# Patient Record
Sex: Male | Born: 1940
Health system: Southern US, Community
[De-identification: ages and names within clinical notes are randomized; demographics above are authoritative.]

## PROBLEM LIST (undated history)

## (undated) DIAGNOSIS — N3010 Interstitial cystitis (chronic) without hematuria: Secondary | ICD-10-CM

## (undated) DIAGNOSIS — I639 Cerebral infarction, unspecified: Secondary | ICD-10-CM

## (undated) DIAGNOSIS — A3790 Whooping cough, unspecified species without pneumonia: Secondary | ICD-10-CM

## (undated) DIAGNOSIS — I10 Essential (primary) hypertension: Secondary | ICD-10-CM

## (undated) DIAGNOSIS — E785 Hyperlipidemia, unspecified: Secondary | ICD-10-CM

## (undated) DIAGNOSIS — A389 Scarlet fever, uncomplicated: Secondary | ICD-10-CM

## (undated) HISTORY — DX: Whooping cough, unspecified species without pneumonia: A37.90

## (undated) HISTORY — PX: CYSTOSTOMY W/ BLADDER BIOPSY: SHX1431

## (undated) HISTORY — DX: Scarlet fever, uncomplicated: A38.9

## (undated) HISTORY — PX: INGUINAL HERNIA REPAIR: SHX194

## (undated) HISTORY — PX: KNEE ARTHROSCOPY: SUR90

## (undated) HISTORY — PX: CATARACT EXTRACTION: SUR2

## (undated) HISTORY — DX: Cerebral infarction, unspecified: I63.9

## (undated) HISTORY — DX: Hyperlipidemia, unspecified: E78.5

---
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
## 2001-02-25 ENCOUNTER — Ambulatory Visit (HOSPITAL_COMMUNITY): Admission: RE | Admit: 2001-02-25 | Discharge: 2001-02-25 | Payer: Self-pay | Admitting: Internal Medicine

## 2001-02-25 ENCOUNTER — Encounter: Payer: Self-pay | Admitting: Internal Medicine

## 2001-03-04 ENCOUNTER — Ambulatory Visit (HOSPITAL_COMMUNITY): Admission: RE | Admit: 2001-03-04 | Discharge: 2001-03-04 | Payer: Self-pay | Admitting: Surgery

## 2003-12-22 ENCOUNTER — Ambulatory Visit: Payer: Self-pay | Admitting: Internal Medicine

## 2004-01-11 ENCOUNTER — Ambulatory Visit (HOSPITAL_COMMUNITY): Admission: RE | Admit: 2004-01-11 | Discharge: 2004-01-11 | Payer: Self-pay | Admitting: Urology

## 2004-01-11 ENCOUNTER — Ambulatory Visit (HOSPITAL_BASED_OUTPATIENT_CLINIC_OR_DEPARTMENT_OTHER): Admission: RE | Admit: 2004-01-11 | Discharge: 2004-01-11 | Payer: Self-pay | Admitting: Urology

## 2004-01-11 ENCOUNTER — Encounter (INDEPENDENT_AMBULATORY_CARE_PROVIDER_SITE_OTHER): Payer: Self-pay | Admitting: *Deleted

## 2004-06-18 ENCOUNTER — Ambulatory Visit: Payer: Self-pay | Admitting: Internal Medicine

## 2004-12-24 ENCOUNTER — Ambulatory Visit: Payer: Self-pay | Admitting: Internal Medicine

## 2004-12-30 ENCOUNTER — Ambulatory Visit: Payer: Self-pay | Admitting: Internal Medicine

## 2005-09-11 ENCOUNTER — Ambulatory Visit: Payer: Self-pay | Admitting: Internal Medicine

## 2005-11-27 ENCOUNTER — Ambulatory Visit: Payer: Self-pay | Admitting: Internal Medicine

## 2005-12-29 ENCOUNTER — Ambulatory Visit: Payer: Self-pay | Admitting: Internal Medicine

## 2006-10-06 ENCOUNTER — Ambulatory Visit: Payer: Self-pay | Admitting: Internal Medicine

## 2006-10-06 LAB — CONVERTED CEMR LAB
BUN: 7 mg/dL (ref 6–23)
Bilirubin Urine: NEGATIVE
CO2: 34 meq/L — ABNORMAL HIGH (ref 19–32)
Calcium: 9.3 mg/dL (ref 8.4–10.5)
Chloride: 104 meq/L (ref 96–112)
Creatinine, Ser: 0.9 mg/dL (ref 0.4–1.5)
Crystals: NEGATIVE
GFR calc Af Amer: 108 mL/min
GFR calc non Af Amer: 89 mL/min
Glucose, Bld: 92 mg/dL (ref 70–99)
Ketones, ur: NEGATIVE mg/dL
Nitrite: NEGATIVE
Potassium: 4.3 meq/L (ref 3.5–5.1)
Sodium: 142 meq/L (ref 135–145)
Specific Gravity, Urine: 1.025 (ref 1.000–1.03)
Squamous Epithelial / HPF: NEGATIVE /lpf
Total Protein, Urine: NEGATIVE mg/dL
Urine Glucose: NEGATIVE mg/dL
Urobilinogen, UA: 0.2 (ref 0.0–1.0)
pH: 6 (ref 5.0–8.0)

## 2007-04-09 DIAGNOSIS — K5730 Diverticulosis of large intestine without perforation or abscess without bleeding: Secondary | ICD-10-CM | POA: Insufficient documentation

## 2007-04-09 DIAGNOSIS — K649 Unspecified hemorrhoids: Secondary | ICD-10-CM | POA: Insufficient documentation

## 2007-04-09 DIAGNOSIS — E785 Hyperlipidemia, unspecified: Secondary | ICD-10-CM

## 2007-04-09 DIAGNOSIS — N3010 Interstitial cystitis (chronic) without hematuria: Secondary | ICD-10-CM

## 2007-04-09 DIAGNOSIS — N400 Enlarged prostate without lower urinary tract symptoms: Secondary | ICD-10-CM | POA: Insufficient documentation

## 2007-11-09 ENCOUNTER — Ambulatory Visit: Payer: Self-pay | Admitting: Internal Medicine

## 2007-11-09 DIAGNOSIS — K6289 Other specified diseases of anus and rectum: Secondary | ICD-10-CM

## 2007-11-09 DIAGNOSIS — A389 Scarlet fever, uncomplicated: Secondary | ICD-10-CM

## 2007-11-09 HISTORY — DX: Scarlet fever, uncomplicated: A38.9

## 2007-11-10 DIAGNOSIS — A3790 Whooping cough, unspecified species without pneumonia: Secondary | ICD-10-CM

## 2007-11-10 HISTORY — DX: Whooping cough, unspecified species without pneumonia: A37.90

## 2007-11-15 ENCOUNTER — Ambulatory Visit: Payer: Self-pay | Admitting: Internal Medicine

## 2007-11-15 LAB — CONVERTED CEMR LAB
BUN: 10 mg/dL (ref 6–23)
Creatinine, Ser: 0.8 mg/dL (ref 0.4–1.5)

## 2007-11-16 ENCOUNTER — Ambulatory Visit: Payer: Self-pay | Admitting: Internal Medicine

## 2007-11-17 ENCOUNTER — Encounter: Payer: Self-pay | Admitting: Internal Medicine

## 2008-03-15 ENCOUNTER — Ambulatory Visit (HOSPITAL_BASED_OUTPATIENT_CLINIC_OR_DEPARTMENT_OTHER): Admission: RE | Admit: 2008-03-15 | Discharge: 2008-03-15 | Payer: Self-pay | Admitting: Urology

## 2008-03-15 ENCOUNTER — Encounter (INDEPENDENT_AMBULATORY_CARE_PROVIDER_SITE_OTHER): Payer: Self-pay | Admitting: Urology

## 2009-06-19 ENCOUNTER — Encounter: Payer: Self-pay | Admitting: Internal Medicine

## 2009-07-25 ENCOUNTER — Encounter: Payer: Self-pay | Admitting: Internal Medicine

## 2009-12-20 ENCOUNTER — Ambulatory Visit: Payer: Self-pay | Admitting: Internal Medicine

## 2010-01-31 ENCOUNTER — Encounter: Payer: Self-pay | Admitting: Internal Medicine

## 2010-02-12 NOTE — Letter (Signed)
[facility]   Imported By: Sherian Rein 07/31/2009 11:30:38  _____________________________________________________________________  External Attachment:    Type:   Image     Comment:   External Document

## 2010-02-12 NOTE — Letter (Signed)
Summary: [facility]   Imported By: Lester [place] 06/26/2009 12:19:01  _____________________________________________________________________  External Attachment:    Type:   Image     Comment:   External Document

## 2010-02-14 ENCOUNTER — Ambulatory Visit: Admit: 2010-02-14 | Payer: Self-pay | Admitting: Internal Medicine

## 2010-02-14 ENCOUNTER — Ambulatory Visit: Payer: Medicare Other | Admitting: Internal Medicine

## 2010-02-14 NOTE — Assessment & Plan Note (Signed)
Summary: INFECTED SORE/NWS   Vital Signs:  Patient profile:   70 year old male Height:      69 inches Weight:      175 pounds BMI:     25.94 O2 Sat:      96 % on Room air Temp:     96.8 degrees F oral Pulse rate:   117 / minute BP sitting:   154 / 90  (left arm) Cuff size:   regular  Vitals Entered By: Bill Salinas CMA (December 20, 2009 4:01 PM)  O2 Flow:  Room air CC: pt here for evaluation of infected sore near his tailbone/ ab   Primary Care Provider:  Jacques Navy MD  CC:  pt here for evaluation of infected sore near his tailbone/ ab.  History of Present Illness: Patient has an area of soreness and drainage of a white material from the intertriginous area between the buttocks just above the coccyx in the area of the sacrum. No bleeding. No fever or chills. the area is painful.   Current Medications (verified): 1)  Flomax 0.4 Mg  Cp24 (Tamsulosin Hcl) .... Take 1 Tablet By Mouth Once A Day 2)  Hydroxyzine Hcl 25 Mg  Tabs (Hydroxyzine Hcl) .... Take 1 Tab By Mouth At Bedtime 3)  Analpram-Hc 1-1 % Crea (Hydrocortisone Ace-Pramoxine) .... Apply To Rectal Area As Needed. 4)  Vesicare 10 Mg Tabs (Solifenacin Succinate) .Marland Kitchen.. 1 Tablet Once Daily 5)  Elmiron 100 Mg Caps (Pentosan Polysulfate Sodium) .... 2 Capsules Two Times A Day  Allergies (verified): 1)  ! * Ivp Dye  Past History:  Past Medical History: Last updated: 11/09/2007 Hx of WHOOPING COUGH (ICD-033.9) Hx of SCARLET FEVER (ICD-034.1) DYSLIPIDEMIA (ICD-272.4) BENIGN PROSTATIC HYPERTROPHY (ICD-600.00) DIVERTICULOSIS, COLON (ICD-562.10) HEMORRHOIDS (ICD-455.6) INTERSTITIAL CYSTITIS (ICD-595.1)  Past Surgical History: Last updated: 11/09/2007 Hernia repair-bilateral '03 (D. Newman) Left knee arthroscopy Fracutre left elbow -avulsion type tonsillectomy remotely SH/Risk Factors reviewed for relevance  Review of Systems       The patient complains of weight loss, muscle weakness, suspicious skin lesions,  and difficulty walking.  The patient denies anorexia, chest pain, syncope, dyspnea on exertion, abdominal pain, severe indigestion/heartburn, depression, and enlarged lymph nodes.    Physical Exam  General:  Well-developed,well-nourished,in no acute distress; alert,appropriate and cooperative throughout examination Head:  normocephalic and atraumatic.   Eyes:  pupils equal and pupils round.   Lungs:  normal respiratory effort.   Heart:  normal rate and regular rhythm.   Abdomen:  soft and normal bowel sounds.   Msk:  no joint tenderness and no joint swelling.   Pulses:  2+ radial Skin:  over the sacrum, in the intertriginous space there are three large skin tags, no abscess lesion or sinus track. The surrounding skin is very erythematous with a scant white fluid/wet lesion. Psych:  normally interactive and not anxious appearing.     Impression & Recommendations:  Problem # 1:  FUNGAL DERMATITIS (ICD-111.9) Area at the sacrum in the intertriginous space  has the appearance of a fungal rash. There is no abscess or sinus track.  Plan - wash and thoroughly dry two times a day           apply topical lamisil two times a day           oral fluconazole 100mg  once daily x 14.           If no improvement over the next 4-5 days will need dermatology consult.  His updated medication list for this problem includes:    Fluconazole 100 Mg Tabs (Fluconazole) .Marland Kitchen... 1 by mouth once daily x 14 days for fungal infection  Complete Medication List: 1)  Flomax 0.4 Mg Cp24 (Tamsulosin hcl) .... Take 1 tablet by mouth once a day 2)  Hydroxyzine Hcl 25 Mg Tabs (Hydroxyzine hcl) .... Take 1 tab by mouth at bedtime 3)  Analpram-hc 1-1 % Crea (Hydrocortisone ace-pramoxine) .... Apply to rectal area as needed. 4)  Vesicare 10 Mg Tabs (Solifenacin succinate) .Marland Kitchen.. 1 tablet once daily 5)  Elmiron 100 Mg Caps (Pentosan polysulfate sodium) .... 2 capsules two times a day 6)  Fluconazole 100 Mg Tabs (Fluconazole)  .Marland Kitchen.. 1 by mouth once daily x 14 days for fungal infection 7)  Doxycycline Hyclate 100 Mg Tabs (Doxycycline hyclate) .Marland Kitchen.. 1 by mouth two times a day x 7 days for possible bacterial skin infection.  Patient Instructions: 1)  area between the cheeks at the sacrum appears to be two enlarged skin tags with no deep wound/a abscess with a surrounding area of redness and swelling. This has the appearance of a fungal/yeast infection. Plan - twice a day: wash with soap and water, thoroughly dry (may need to use a hair dryer), then apply a thin coat of lamisil. Also take fluconazole 100 mg daily for 14 days (antifungal medication) and doxycycline 100mg  two times a day for 7 days. If no improvement in a week weill refer to dermatology. Prescriptions: DOXYCYCLINE HYCLATE 100 MG TABS (DOXYCYCLINE HYCLATE) 1 by mouth two times a day x 7 days for possible bacterial skin infection.  #14 x 0   Entered and Authorized by:   Jacques Navy MD   Signed by:   Jacques Navy MD on 12/20/2009   Method used:   Electronically to        [facility]  [phone]* (retail)       [address] [place], [place]  [address]       Ph: [phone] or [phone]       Fax: [phone]   RxID:   [id number] FLUCONAZOLE 100 MG TABS (FLUCONAZOLE) 1 by mouth once daily x 14 days for fungal infection  #14 x 0   Entered and Authorized by:   Jacques Navy MD   Signed by:   Jacques Navy MD on 12/20/2009   Method used:   Electronically to        [facility]  [phone]* (retail)       [address] [place], [place]  [address]       Ph: [phone] or [phone]       Fax: [phone]   RxID:   [id number]    Orders Added: 1)  Est. Patient Level III [42706]

## 2010-02-14 NOTE — Letter (Signed)
[facility]   Imported By: Sherian Rein 02/07/2010 10:53:10  _____________________________________________________________________  External Attachment:    Type:   Image     Comment:   External Document

## 2010-03-04 ENCOUNTER — Ambulatory Visit (INDEPENDENT_AMBULATORY_CARE_PROVIDER_SITE_OTHER): Payer: Medicare Other | Admitting: Internal Medicine

## 2010-03-04 ENCOUNTER — Encounter: Payer: Self-pay | Admitting: Internal Medicine

## 2010-03-04 DIAGNOSIS — N3010 Interstitial cystitis (chronic) without hematuria: Secondary | ICD-10-CM

## 2010-03-04 DIAGNOSIS — Z23 Encounter for immunization: Secondary | ICD-10-CM

## 2010-03-04 DIAGNOSIS — K6289 Other specified diseases of anus and rectum: Secondary | ICD-10-CM

## 2010-03-12 NOTE — Assessment & Plan Note (Signed)
Summary: f/u appt/#/cd   Vital Signs:  Patient profile:   70 year old male Height:      68 inches Weight:      177 pounds (80.45 kg) BMI:     27.01 O2 Sat:      94 % on Room air Temp:     97.9 degrees F (36.61 degrees C) oral Pulse rate:   108 / minute Resp:     14 per minute BP sitting:   102 / 68  (left arm) Cuff size:   regular  Vitals Entered By: Burnard Leigh CMA(AAMA) (March 04, 2010 2:23 PM)  O2 Flow:  Room air CC: F/U on tailbone sore..Discuss bowels & rectum issues..Discuss vaccines/sls,cma Is Patient Diabetic? No Comments Sore is healing..Pt states he is having issues w/bowels & rectum internal and external.. Would like to get Flu vaccine & discuss Zostavax vaccine/sls,cma   Primary Care Provider:  Jacques Navy MD  CC:  F/U on tailbone sore..Discuss bowels & rectum issues..Discuss vaccines/sls and cma.  History of Present Illness: Cody [name] presents for follow-up. He is followed by urology for biopsy diagnosed Interstial cystitis. He continues to have perineal pain, back pain. HE does report that he can get relief of his symptoms with micturition. He has had a very thorough evaluation including CT imagaing abdomen and pelvis, he has had colonoscopy which was unrevealing. No other etiology for his pain has been determined.  Cody [name] in the past has had skin breakdown/candidiasis at the sacrum successfully treated with topical antifungal and oral antifungal agents. He is having recurrent pain at the sacrum and it is painful to sit down.   Current Medications (verified): 1)  Flomax 0.4 Mg  Cp24 (Tamsulosin Hcl) .... Take 1 Tablet By Mouth Once A Day 2)  Hydroxyzine Hcl 25 Mg  Tabs (Hydroxyzine Hcl) .... Take 1 Tab By Mouth At Bedtime 3)  Vesicare 10 Mg Tabs (Solifenacin Succinate) .Marland Kitchen.. 1 Tablet Once Daily 4)  Elmiron 100 Mg Caps (Pentosan Polysulfate Sodium) .... 2 Capsules Two Times A Day  Allergies (verified): 1)  ! * Ivp Dye  Past History:  Past  Medical History: Last updated: 11/09/2007 Hx of WHOOPING COUGH (ICD-033.9) Hx of SCARLET FEVER (ICD-034.1) DYSLIPIDEMIA (ICD-272.4) BENIGN PROSTATIC HYPERTROPHY (ICD-600.00) DIVERTICULOSIS, COLON (ICD-562.10) HEMORRHOIDS (ICD-455.6) INTERSTITIAL CYSTITIS (ICD-595.1)  Past Surgical History: Last updated: 11/09/2007 Hernia repair-bilateral '03 (D. 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  Review of Systems  The patient denies anorexia, fever, weight loss, weight gain, decreased hearing, chest pain, syncope, dyspnea on exertion, prolonged cough, abdominal pain, severe indigestion/heartburn, incontinence, muscle weakness, difficulty walking, abnormal bleeding, and enlarged lymph nodes.    Physical Exam  General:  somewhat haggard appearing white male Head:  normocephalic and atraumatic.   Eyes:  C&S clear Neck:  supple and full ROM.   Lungs:  normal respiratory effort, normal breath sounds, no crackles, and no wheezes.   Heart:  normal rate and regular rhythm.   Skin:  raw appearing erythematous lesion at the sacrum with erosions and wet exudate without odor or appearance of frank purulence. Psych:  Oriented X3, normally interactive, and good eye contact.     Impression & Recommendations:  Problem # 1:  ANAL OR RECTAL PAIN (ICD-569.42) fungal/candidiasis lesion at the sacrum  Plan - wash with soap and water and carefully dry -  use of hair dryer to insure dryness           application of lamisil           Carry out this treatment two times a day.  Problem # 2:  INTERSTITIAL CYSTITIS (ICD-595.1) long term chronic problem and most likely cause of perineal/abdominal pain.   Complete Medication List: 1)  Flomax 0.4 Mg Cp24 (Tamsulosin hcl) .... Take 1 tablet by mouth once a day 2)   Hydroxyzine Hcl 25 Mg Tabs (Hydroxyzine hcl) .... Take 1 tab by mouth at bedtime 3)  Vesicare 10 Mg Tabs (Solifenacin succinate) .Marland Kitchen.. 1 tablet once daily 4)  Elmiron 100 Mg Caps (Pentosan polysulfate sodium) .... 2 capsules two times a day  Other Orders: Flu Vaccine 67yrs + MEDICARE PATIENTS (N5621) Administration Flu vaccine - MCR (G0008) TD Toxoids IM 7 YR + (30865) Influenza Vaccine MCR (78469) Admin 1st Vaccine (62952)   Orders Added: 1)  Flu Vaccine 61yrs + MEDICARE PATIENTS [Q2039] 2)  Administration Flu vaccine - MCR [G0008] 3)  TD Toxoids IM 7 YR + [90714] 4)  Influenza Vaccine MCR [00025] 5)  Admin 1st Vaccine [90471] 6)  Est. Patient Level III [84132]   Immunizations Administered:  Influenza Vaccine # 1:    Vaccine Type: Fluvax MCR    Site: left deltoid    Mfr: Sanofi Pasteur    Dose: 0.5 ml    Route: IM    Given by: Burnard Leigh CMA(AAMA)    Exp. Date: 07/13/2010    Lot #: [id number]    VIS given: 08/07/09 version given March 04, 2010.  Tetanus Vaccine:    Vaccine Type: Td    Site: right deltoid    Mfr: Sanofi Pasteur    Dose: 0.5 ml    Route: IM    Given by: Burnard Leigh CMA(AAMA)    Exp. Date: 04/26/2011    Lot #: [id number]    VIS given: 12/01/07 version given March 04, 2010.   Immunizations Administered:  Influenza Vaccine # 1:    Vaccine Type: Fluvax MCR    Site: left deltoid    Mfr: Sanofi Pasteur    Dose: 0.5 ml    Route: IM    Given by: Burnard Leigh CMA(AAMA)    Exp. Date: 07/13/2010    Lot #: [id number]    VIS given: 08/07/09 version given March 04, 2010.  Tetanus Vaccine:    Vaccine Type: Td    Site: right deltoid    Mfr: Sanofi Pasteur    Dose: 0.5 ml    Route: IM    Given by: Burnard Leigh CMA(AAMA)    Exp. Date: 04/26/2011    Lot #: [id number]    VIS given: 12/01/07 version given March 04, 2010.

## 2010-04-25 LAB — BASIC METABOLIC PANEL
BUN: 10 mg/dL (ref 6–23)
CO2: 27 mEq/L (ref 19–32)
Calcium: 9.1 mg/dL (ref 8.4–10.5)
Chloride: 103 mEq/L (ref 96–112)
Creatinine, Ser: 0.79 mg/dL (ref 0.4–1.5)
GFR calc Af Amer: >60 mL/min (ref >60)
GFR calc non Af Amer: >60 mL/min (ref >60)
Glucose, Bld: 87 mg/dL (ref 70–99)
Potassium: 4.2 mEq/L (ref 3.5–5.1)
Sodium: 139 mEq/L (ref 135–145)

## 2010-04-25 LAB — CBC
HCT: 52.3 % — ABNORMAL HIGH (ref 39.0–52.0)
Hemoglobin: 17.3 g/dL — ABNORMAL HIGH (ref 13.0–17.0)
MCHC: 33.1 g/dL (ref 30.0–36.0)
MCV: 90.3 fL (ref 78.0–100.0)
Platelets: 198 10*3/uL (ref 150–400)
RBC: 5.8 MIL/uL (ref 4.22–5.81)
RDW: 14.2 % (ref 11.5–15.5)
WBC: 8.5 10*3/uL (ref 4.0–10.5)

## 2010-05-28 NOTE — Op Note (Signed)
NAME:  Cody [name], Cody [name] NO.:  [id number]   MEDICAL RECORD NO.:  [id number]          PATIENT TYPE:  AMB   LOCATION:  [facility]                         FACILITY:  [facility]   PHYSICIAN:  Houston M. Kimbrough, M.D.DATE OF BIRTH:  [birth year]   DATE OF PROCEDURE:  03/15/2008  DATE OF DISCHARGE:                               OPERATIVE REPORT   PREOPERATIVE DIAGNOSIS:  Chronic interstitial cystitis, inflammatory  lesion, right posterior base, microscopic hematuria.   POSTOPERATIVE DIAGNOSIS:  Chronic interstitial cystitis, inflammatory  lesion, right posterior base, microscopic hematuria.   OPERATION:  Cystoscopy, bladder biopsy and fulguration, right posterior  base (1.5 cm), and hydraulic distention of the bladder.   ANESTHESIA:  General.   SURGEON:  Courtney Paris, M.D.   BRIEF HISTORY:  This 70 year old patient with previous IC diagnosed on  biopsy by Dr. Logan Bores in 2005 comes in with small-capacity, unstable  bladder on video urodynamic study.  He also has symptoms of obstruction,  but has been on Flomax.  He has frequency every hour or less.  He gets  up twice a night.  He also has frequent stools possible IBS.  He enters  now because on office cystoscopy he had some markedly inflamed areas of  the posterior base of the bladder, mainly on the right side and a  positive NMP-22.  It is thought that he has chronic IC, but he has not  been on Elmiron for years because of diarrhea.  He enters now for  bladder biopsy at this time.  The patient requests a Foley at the end of  the procedure.   The patient was placed on the operating table in the supine position.  After satisfactory induction of general anesthesia, he was placed in  dorsal lithotomy.  He was prepped and draped with a non-iodine-  containing substance due to his questionable history of iodine allergy.  Time-out was then performed and the patient and procedure then re-  confirmed.   His rectum was quite  irritated, as well, from his chronic diarrhea.  A  21 panendoscope was passed.  Anterior urethra was normal.  Post urethra  was nonobstructing.  The bladder was entered.  The bladder was markedly  inflamed and very sensitive.  With any kind of distention, 100 mL or  less, he had pain.  Anesthesia had difficulty keeping him at a deep  level of anesthesia.  I did get a view of the right posterior base and  took three biopsies of this area and then fulgurated the area for about  1.5 cm.  This effected pretty good hemostasis.  I then distended the  bladder to probably only about 200 mL under anesthesia.  There was some  oozing from different areas, which were fulgurated, but hemostasis  appeared to be adequate.  The bladder was then drained, scope removed  and a #20 Foley catheter inserted and the bladder irrigated with a liter  of saline to make the efflux just light pink.  This was left to straight  drainage, attached to a leg bag.  The patient was taken to the  recovery  room in good condition.  He did get a B and O suppository.   It looks like he has IC.  We will put him back on Elmiron, put him on  some oxycodone for pain, put him on some Vesicare for his urgency and  some antibiotics with Cipro.  We will leave the catheter until next  week, remove it at that time and proceed after the biopsy reports are  obtained.      Courtney Paris, M.D.  Electronically Signed     HMK/MEDQ  D:  03/15/2008  T:  03/15/2008  Job:  [id number]

## 2010-05-31 NOTE — Op Note (Signed)
NAME:  Cody [name], Cody [name] NO.:  [id number]   MEDICAL RECORD NO.:  [id number]          PATIENT TYPE:  AMB   LOCATION:  [facility]                         [facility]AC[facility]   PHYSICIAN:  Jamison Neighbor, M.D.  DATE OF BIRTH:  Dec 07, 1940   DATE OF PROCEDURE:  01/11/2004  DATE OF DISCHARGE:                                 OPERATIVE REPORT   PREOPERATIVE DIAGNOSIS:  Hematuria.   SECONDARY DIAGNOSIS:  Chronic pelvic pain syndrome/chronic prostatitis.   POSTOPERATIVE DIAGNOSES:  1.  Hematuria.  2.  Probable interstitial cystitis.   PROCEDURE:  Cystoscopy, attempted bilateral retrogrades, bladder biopsy x5  (right, left, dome, posterior and trigone).   SURGEON:  Jamison Neighbor, M.D.   ANESTHESIA:  General.   COMPLICATIONS:  None.   DRAINS:  None.   INDICATIONS FOR PROCEDURE:  This 70 year old male has been treated for a  number of years for what has been presumed to be chronic prostatitis.  The  patient was treated in the usual fashion with alpha blockade, anti-  inflammatories and antibiotics, and feels that he might be somewhat better.  Unfortunately, however, he has continued to have problems with persistent  hematuria and pyuria, even though he has negative urine cultures.  The  patient underwent a cystoscopic examination, which showed moderate  enlargement of the prostate, but no real obstruction, and some patchy areas  that were quite irregular within the bladder.  The patient is now to undergo  diagnostic cystoscopy with biopsy of the bladder.  He understands the risks  and benefits of the procedure, and gave a full informed consent.   DESCRIPTION OF PROCEDURE:  After a successful induction of general  anesthesia, the patient was placed in the dorsal lithotomy position and  prepped with Betadine and draped in the usual sterile fashion.  A cystoscopy  was performed.  The urethra was visualized in its entirety and was found to  be normal.  Beyond the  verumontanum, there was not much in the way of  prostatic enlargement, certainly nothing that was causing obstruction.  The  bladder was carefully inspected, and almost immediately upon filling, there  was bleeding throughout the bladder.  No tumors or stones could be seen, but  the mucosa was very irregular, and even with minimal filling, began to bleed  and glomerular-like, consistent with a severe case of interstitial cystitis.  The ureters could never be well-visualized.  It was noted that during the  filling stage, even before the decision was made to perform a formal  hydrodistention, the patient exhibited significant pain and moved quite a  bit on the table.  He was given a deeper anesthetic, but it was clear that  this was very stimulating for the patient, which once again would suggest an  IC diagnosis as a possibility.  The decision was made to perform a  hydrodistention.  The patient was filled for five minutes and drained, and  as expected globalizations and bleeding were seen throughout the bladder.  The bladder capacity was only 250 mL, which is exceptionally low, given the  normal bladder capacity,  even in women is 1200 mL on the average.  For IC is  approximately 575 mL.  The patient had five separate biopsies done, in order  to ensure that there was good coverage.  This included the right side, left  side, dome, posterior and trigone areas.  These were all cauterized.  The  patient's bladder was drained, and he was washed out several times.  He tolerated the procedure well and was taken to the recovery room in good  condition.   DISPOSITION:  He will be sent home with pain medicine plus an antibiotic,  and will return in followup.  We give some consideration to placing him on  an Elmiron-based protocol, as well as his Lasix therapy, to try to treat  this bladder condition.      RJE/MEDQ  D:  01/11/2004  T:  01/11/2004  Job:  [id number]   cc:   Rosalyn Gess. Norins, M.D.  [facility]

## 2010-05-31 NOTE — Assessment & Plan Note (Signed)
HEALTHC[facility]                           GASTROENTEROLOGY OFFICE NOTE   NAME:[name], [name]                       MRN:          [id number]  DATE:  11/27/2005                              DOB:    REFERRING PHYSICIAN:  Rosalyn Gess. Norins, MD   CHIEF COMPLAINT:  Change in bowel habits, rectal pain.   ASSESSMENT:  1. Change in bowel habits with change in stool caliber and frequency.  He      has some pelvic pain and rectal pain with this as well.  He has      interstitial cystitis.  I am suspicious of irritable bowel phenomenon      but in his age group, a change in bowel habits like this could      represent colon cancer or perhaps even an inflammatory bowel disease.      Previous colonoscopy September 12, 2003 demonstrated diverticulosis and      small hemorrhoids.   PLAN:  1. Colonoscopy to investigate.  2. Trial of Levbid.  3. He has follow-up with Dr. Logan [name] (yearly follow-up) soon.  He has      stopped his Elmiron because that caused some diarrhea.   HISTORY OF PRESENT ILLNESS:  This is a 70 year old white man that I know  from previous screening colonoscopy.  Since that time he was diagnosed with  interstitial cystitis.  He has varying degrees of pressure discomfort and  stinging needle-like pain in the groin and pelvic area and in the rectal  area at times.  He has flat and short and smaller stools at times.  This is  intermittent.  He can have several bowel movements and he has soreness.  He  has a lot of problems when he is sitting.  He feels better when he is lying  down.  He has urinary frequency and these symptoms seem to aggravate each  other.  Sometimes when he goes to defecate he urinates unexpectedly, he  says.  He has some urge problems and some urge incontinence or near  incontinence at times, it sounds like.  He denies any bleeding.  He was  recently treated for suspected prostatitis by Dr. Debby [name] with some benefit  (course of  Cipro).   MEDICATIONS:  1. Flomax 0.4 mg daily.  2. Saw Palmetto daily.  3. Aspirin 81 mg daily.  4. Hydroxyzine 25 mg q.h.s.  5. Citrucel p.r.n.   ALLERGIES:  Drug allergies:  None known.   PAST MEDICAL HISTORY:  1. Interstitial cystitis.  2. Hernia repair.  3. Diverticulosis and hemorrhoids.  4. Left knee arthroscopy.  5. Benign prostatic hypertrophy.  6. Dyslipidemia.   FAMILY HISTORY:  Mother had colon cancer.  I do not believe I was aware of  that at the time of his previous colonoscopy.   SOCIAL HISTORY:  He is married.  He is Dr. Ludwig [name] father-in-law.  He is  working as a Librarian, academic.  No alcohol, tobacco, or drugs.   REVIEW OF SYSTEMS:  Otherwise negative, except for those things mentioned  above.   PHYSICAL EXAMINATION:  GENERAL:  Well-developed, well-nourished, elderly,  white man.  VITAL SIGNS:  Weight 187 pounds, blood pressure 168/90, pulse 88.  HEENT:  Eyes anicteric.  ENT:  Normal.  NECK:  Supple.  No thyromegaly or masses.  CHEST:  Clear.  HEART:  Without murmurs or gallops.  ABDOMEN:  Soft, nontender.  No masses.  RECTAL:  Mildly tender to touch.  He has some perianal erythema.  The  prostate is mildly enlarged and mildly tender.  LYMPHATICS:  No supraclavicular nodes.  SKIN:  No rash except for the perianal erythema.  It is confluent.  There  are no satellite lesions.  PSYCHIATRIC:  He is alert and oriented x3.   Note:  I have given him some samples of Calmoseptine ointment to apply to  his perianal area to try to help this rash.  He does not seem to have  leakage of stool or diarrhea to any great degree.  He has been cleansing  quite a bit with wipes and KY Jelly.  Maybe it is a traumatic process from  all of that.     Iva Boop, MD,FACG  Electronically Signed    CEG/MedQ  DD: 11/27/2005  DT: 11/27/2005  Job #: [id number]   cc:   Cody Gess. Cody Bud, MD  Cody [name], M.D.

## 2010-05-31 NOTE — Assessment & Plan Note (Signed)
[facility]                             PRIMARY CARE OFFICE NOTE   NAME:Cody [name], Cody [name]                       MRN:          [id number]  DATE:09/11/2005                            DOB:          1940-11-15    [name] is a very pleasant 70 year old gentleman who presents  complaining of low back pain, perineal pain, discomfort with bowel movement,  change in bowel caliber.  Reports the pain is frequently associated with  bowel movements.  He has chronic urinary urgency and frequency but has noted  slight increase recently and a little bit of difficulty starting a stream.  Patient has been seen by Dr. Eudelia Bunch in the past, last office note from  October 30, 2004.  He has had a problem with chronic pelvic pain, syndrome  of interstitial cystitis.  The patient did have cystoscopy in December,  2005.   PAST MEDICAL HISTORY:  Remarkable for arthroscopy of the left knee,  laparoscopic hernia repair bilaterally.  Medical illnesses include BPH,  hyperlipidemia, interstitial cystitis.   CURRENT MEDICATIONS:  1. Flomax 0.4 mg daily.  2. Saw palmetto daily.  3. Baby aspirin daily.  4. Hydroxyzine 25 mg nightly.  5. Citrucel.   PHYSICAL EXAMINATION:  VITAL SIGNS:  Temperature was 97.9, blood pressure  155/88, pulse 84.  Weight 187.  GENERAL APPEARANCE:  A well-developed and well-nourished Caucasian male in  no acute distress.  ABDOMEN:  Patient has hypoactive bowel sounds throughout.  He had no  guarding or rebound.  He had tenderness to palpation bilaterally in the  lower quadrants.  RECTAL:  Patient had normal sphincter tone.  He had a small hemorrhoid.  Patient's prostate was warm to the touch, slightly boggy, and exquisitely  tender to palpation.   IMPRESSION/PLAN:  Acute prostatitis:  Patient is to laboratory for post  prostatitic massage UA with mid stream catch.  He is started on  ciprofloxacin 250 mg b.i.d. for 14 days with one refill on  that  prescription.  Patient does have a follow-up appointment scheduled with Dr.  Eudelia Bunch, and I told him to keep his appointment as scheduled.                                   Rosalyn Gess Norins, MD   MEN/MedQ  DD:  09/11/2005  DT:  09/11/2005  Job #:  [id number]   cc:   Jamison Neighbor, MD

## 2010-05-31 NOTE — Op Note (Signed)
[facility]  Patient:    Cody [name], Cody [name] Visit Number: [id number] MRN: [id number]          Service Type: Attending:  Sandria Bales. Ezzard Standing, M.D. Dictated by:   Sandria Bales. Ezzard Standing, M.D. Proc. Date: 03/04/01   CC:         Rosalyn Gess. Norins, M.D. [facility]  Madolyn Frieze. Jens Som, M.D. [facility]   Operative Report  DATE OF BIRTH:  [birth year]  PREOPERATIVE DIAGNOSES: 1. Large right inguinal hernias. 2. Small left inguinal hernia.  POSTOPERATIVE DIAGNOSIS:  Large direct right inguinal hernias with small indirect hernia on the right and then small both indirect and direct hernia on the left.  PROCEDURE:  Bilateral laparoscopic inguinal hernia repair with precut atrium mesh on the left and double mesh of one of precut and one of an onlay mesh on the right.  SURGEON:  Sandria Bales. Ezzard Standing, M.D.  FIRST ASSISTANT:  None.  ANESTHESIA:  General endotracheal.  ESTIMATED BLOOD LOSS:  Minimal.  INDICATION FOR PROCEDURE:  Cody [name] is a 70 year old male, patient of Dr. Illene Regulus, who comes with apparent bilateral inguinal hernias.  He has had a right inguinal hernia for at least a year.  This hernia has gotten steadily larger.  He has never had an hernia operation.  He has by physical examination a smaller left inguinal hernia and comes for attempted laparoscopic inguinal hernia bilaterally.  OPERATIVE NOTE:  The patient placed in the supine position with his arms tucked to his side.  He was given a general endotracheal anesthetic, given 1 g of Ancef at the initiation of the procedure.  A Foley catheter was placed. His lower abdomen shaved, prepped with Betadine solution, and sterilely draped.  An infraumbilical incision was made with sharp dissection and carried down to the rectus abdominis fascia.  I then went through the anterior rectus fascia on the right, retracting the rectus abdominal muscle anteriorly and getting to the preperitoneal space with the PBD balloon  and insufflated this balloon under direct visualization.  I got good dissection on the right and next reduced most of the direct hernia on the right.  However, the dissection on the left was poor.  I then removed the balloon and placed two 5 mm trocars in both lower quadrants, on in the right lower quadrant and one in the left lower quadrant in this preperitoneal space.  Actually on the left side, I got into the peritoneum, retracted the trocar, then reinserted it into the correct space.  I first dissected on the right where I identified a large direct inguinal hernia.  It had a 3-4 cm neck on it, had protruded well through the inguinal floor.  I also found a small indirect right inguinal hernia on the right, but I am not sure this is a true pantaloon hernia.  I then turned my attention to the left side where again he had a small hernia on the direct component almost a mirror image of the right side being much smaller, and then he actually had a little bit more stuck up internal hernia which I tore the peritoneum getting the hernia sac down.  I teased down the hernia sac.  I doubly clipped it with a 5 mm clip just to close the defect that I created.  I then put a precut piece of atrium mesh in the left side and tacked this with a Protack, used 15 staples on the left.  Then on the right side, I put two  pieces of mesh because he had such a large direct right inguinal hernia.  The first piece of mesh was the precut atrium mesh.  I put 14 staples in to hold that in place, and a second piece of mesh was a 4 x 6 piece of mesh which overlapped the prior mesh and also a little bit further laterally and a little bit further up the anterior abdominal wall, and this was tolerated well.  Final sponge, needle, and instrument counts were correct at the end of the case.  The patient had his Foley removed and was transferred to the recovery room in good condition. Dictated by:   Sandria Bales. Ezzard Standing,  M.D. Attending:  Sandria Bales. Ezzard Standing, M.D. DD:  03/04/01 TD:  03/04/01 Job: [id number]

## 2012-07-22 ENCOUNTER — Other Ambulatory Visit: Payer: Self-pay | Admitting: Cardiology

## 2012-07-23 NOTE — Telephone Encounter (Signed)
To verify before sending, will route this to Dr. Jens Som Nurse, not a cardiac med will need his approval

## 2012-07-26 NOTE — Telephone Encounter (Signed)
Will need to get from PCP 

## 2012-08-12 ENCOUNTER — Other Ambulatory Visit: Payer: Self-pay | Admitting: Internal Medicine

## 2012-08-12 MED ORDER — SOLIFENACIN SUCCINATE 5 MG PO TABS: 5.0000 mg | ORAL_TABLET | Freq: Every day | ORAL | Status: DC

## 2012-08-12 MED ORDER — TAMSULOSIN HCL 0.4 MG PO CAPS: 0.4000 mg | ORAL_CAPSULE | Freq: Every day | ORAL | Status: AC

## 2012-08-12 MED ORDER — HYDROXYZINE HCL 10 MG PO TABS: 5.0000 mg | ORAL_TABLET | Freq: Every day | ORAL | Status: DC

## 2012-08-12 MED ORDER — PENTOSAN POLYSULFATE SODIUM 100 MG PO CAPS: 200.0000 mg | ORAL_CAPSULE | Freq: Every day | ORAL | Status: AC

## 2013-07-21 DIAGNOSIS — R350 Frequency of micturition: Secondary | ICD-10-CM | POA: Diagnosis not present

## 2013-07-21 DIAGNOSIS — R351 Nocturia: Secondary | ICD-10-CM | POA: Diagnosis not present

## 2013-07-21 DIAGNOSIS — R3915 Urgency of urination: Secondary | ICD-10-CM | POA: Diagnosis not present

## 2013-07-21 DIAGNOSIS — N3010 Interstitial cystitis (chronic) without hematuria: Secondary | ICD-10-CM | POA: Diagnosis not present

## 2013-09-12 ENCOUNTER — Encounter: Payer: Self-pay | Admitting: Internal Medicine

## 2014-09-20 DIAGNOSIS — R350 Frequency of micturition: Secondary | ICD-10-CM | POA: Diagnosis not present

## 2014-09-20 DIAGNOSIS — N3010 Interstitial cystitis (chronic) without hematuria: Secondary | ICD-10-CM | POA: Diagnosis not present

## 2014-09-20 DIAGNOSIS — N138 Other obstructive and reflux uropathy: Secondary | ICD-10-CM | POA: Diagnosis not present

## 2014-09-20 DIAGNOSIS — N401 Enlarged prostate with lower urinary tract symptoms: Secondary | ICD-10-CM | POA: Diagnosis not present

## 2015-02-09 ENCOUNTER — Ambulatory Visit (INDEPENDENT_AMBULATORY_CARE_PROVIDER_SITE_OTHER): Payer: Medicare Other | Admitting: Internal Medicine

## 2015-02-09 ENCOUNTER — Encounter: Payer: Self-pay | Admitting: Internal Medicine

## 2015-02-09 ENCOUNTER — Other Ambulatory Visit (INDEPENDENT_AMBULATORY_CARE_PROVIDER_SITE_OTHER): Payer: Medicare Other

## 2015-02-09 VITALS — BP 140/94 | HR 116 | Temp 98.3°F | Resp 18

## 2015-02-09 DIAGNOSIS — R5381 Other malaise: Secondary | ICD-10-CM | POA: Diagnosis not present

## 2015-02-09 DIAGNOSIS — R030 Elevated blood-pressure reading, without diagnosis of hypertension: Secondary | ICD-10-CM

## 2015-02-09 DIAGNOSIS — R739 Hyperglycemia, unspecified: Secondary | ICD-10-CM | POA: Diagnosis not present

## 2015-02-09 DIAGNOSIS — N400 Enlarged prostate without lower urinary tract symptoms: Secondary | ICD-10-CM

## 2015-02-09 DIAGNOSIS — N3010 Interstitial cystitis (chronic) without hematuria: Secondary | ICD-10-CM

## 2015-02-09 DIAGNOSIS — R7303 Prediabetes: Secondary | ICD-10-CM | POA: Insufficient documentation

## 2015-02-09 DIAGNOSIS — E785 Hyperlipidemia, unspecified: Secondary | ICD-10-CM

## 2015-02-09 DIAGNOSIS — R531 Weakness: Secondary | ICD-10-CM

## 2015-02-09 DIAGNOSIS — IMO0001 Reserved for inherently not codable concepts without codable children: Secondary | ICD-10-CM

## 2015-02-09 DIAGNOSIS — Z23 Encounter for immunization: Secondary | ICD-10-CM | POA: Diagnosis not present

## 2015-02-09 LAB — COMPREHENSIVE METABOLIC PANEL
ALK PHOS: 78 U/L (ref 39–117)
ALT: 9 U/L (ref 0–53)
AST: 13 U/L (ref 0–37)
Albumin: 4.1 g/dL (ref 3.5–5.2)
BILIRUBIN TOTAL: 0.6 mg/dL (ref 0.2–1.2)
BUN: 14 mg/dL (ref 6–23)
CO2: 29 mEq/L (ref 19–32)
Calcium: 9.2 mg/dL (ref 8.4–10.5)
Chloride: 101 mEq/L (ref 96–112)
Creatinine, Ser: 1.1 mg/dL (ref 0.40–1.50)
GFR: 69.03 mL/min (ref >60.00)
GLUCOSE: 100 mg/dL — AB (ref 70–99)
Potassium: 4 mEq/L (ref 3.5–5.1)
Sodium: 139 mEq/L (ref 135–145)
TOTAL PROTEIN: 7.5 g/dL (ref 6.0–8.3)

## 2015-02-09 LAB — HEMOGLOBIN A1C: Hgb A1c MFr Bld: 5.6 % (ref 4.6–6.5)

## 2015-02-09 LAB — CBC WITH DIFFERENTIAL/PLATELET
BASOS ABS: 0 10*3/uL (ref 0.0–0.1)
Basophils Relative: 0.3 % (ref 0.0–3.0)
Eosinophils Absolute: 0.2 10*3/uL (ref 0.0–0.7)
Eosinophils Relative: 1.5 % (ref 0.0–5.0)
HEMATOCRIT: 55.8 % — AB (ref 39.0–52.0)
Hemoglobin: 18.3 g/dL (ref 13.0–17.0)
LYMPHS PCT: 9.7 % — AB (ref 12.0–46.0)
Lymphs Abs: 1.1 10*3/uL (ref 0.7–4.0)
MCHC: 32.7 g/dL (ref 30.0–36.0)
MCV: 88 fl (ref 78.0–100.0)
MONOS PCT: 8.5 % (ref 3.0–12.0)
Monocytes Absolute: 0.9 10*3/uL (ref 0.1–1.0)
NEUTROS PCT: 80 % — AB (ref 43.0–77.0)
Neutro Abs: 8.7 10*3/uL — ABNORMAL HIGH (ref 1.4–7.7)
Platelets: 196 10*3/uL (ref 150.0–400.0)
RBC: 6.34 Mil/uL — AB (ref 4.22–5.81)
RDW: 14.1 % (ref 11.5–15.5)
WBC: 10.8 10*3/uL — AB (ref 4.0–10.5)

## 2015-02-09 LAB — LIPID PANEL
CHOL/HDL RATIO: 4
Cholesterol: 170 mg/dL (ref 0–200)
HDL: 38.1 mg/dL — ABNORMAL LOW (ref >39.00)
LDL Cholesterol: 115 mg/dL — ABNORMAL HIGH (ref 0–99)
NONHDL: 131.8
TRIGLYCERIDES: 82 mg/dL (ref 0.0–149.0)
VLDL: 16.4 mg/dL (ref 0.0–40.0)

## 2015-02-09 LAB — TSH: TSH: 4.44 u[IU]/mL (ref 0.35–4.50)

## 2015-02-09 NOTE — Progress Notes (Signed)
Subjective:    Patient ID: Cody [name], male    DOB: Oct 23, 1940, 75 y.o.   MRN: [id number]  HPI  He is here to establish with a new pcp.   He has not seen a pcp in several years.  He currently follows with urology only.   Interstitial cystitis, BPH:  He is following with urology.  He is taking his meds as prescribed.  He feels his symptoms are well controlled at this point, but they were not always controlled. At one point he had significant pain from his interstitial cystitis. This was a few years ago and he was in bed all day and night because any movement made the pain worse.  Since that time he has been very inactive.  Because of the inactivity and sedentary lifestyle. He has extreme weakness and deconditioning.  He eats in bed. He is able to ambulate around his apartment and uses a walker. Besides that he does not ambulate or go out of the house. He had significant difficulty getting into and out of the car today to come here.  Abdomen his wife deny any other significant injuries contributing to his weakness. He denies any pain.  Overall he does not have any concerns or questions, but has not had a primary care physician and has not had blood work in years.   Medications and allergies reviewed with patient and updated if appropriate.  Patient Active Problem List   Diagnosis Date Noted  . Physical deconditioning 02/10/2015  . Weakness generalized 02/10/2015  . Hyperglycemia 02/09/2015  . ANAL OR RECTAL PAIN 11/09/2007  . Hyperlipidemia 04/09/2007  . HEMORRHOIDS 04/09/2007  . DIVERTICULOSIS, COLON 04/09/2007  . INTERSTITIAL CYSTITIS 04/09/2007  . BPH (benign prostatic hyperplasia) 04/09/2007    Current Outpatient Prescriptions on File Prior to Visit  Medication Sig Dispense Refill  . hydrOXYzine (ATARAX/VISTARIL) 10 MG tablet Take 0.5 tablets (5 mg total) by mouth daily. 30 tablet 0  . pentosan polysulfate (ELMIRON) 100 MG capsule Take 2 capsules (200 mg total) by mouth  daily. (Patient taking differently: Take 100 mg by mouth daily. )    . solifenacin (VESICARE) 5 MG tablet Take 1 tablet (5 mg total) by mouth daily.    . tamsulosin (FLOMAX) 0.4 MG CAPS Take 1 capsule (0.4 mg total) by mouth daily. 30 capsule 3   No current facility-administered medications on file prior to visit.    Past Medical History  Diagnosis Date  . WHOOPING COUGH 11/10/2007    Qualifier: History of  By: Debby Bud MD, Rosalyn Gess Scarlet fever 11/09/2007    Annotation: as a child, no sequellae Qualifier: History of  By: Debby Bud MD, Rosalyn Gess     Past Surgical History  Procedure Laterality Date  . Inguinal hernia repair Bilateral   . Knee arthroscopy Left     Social History   Social History  . Marital Status: Married    Spouse Name: N/A  . Number of Children: N/A  . Years of Education: N/A   Social History Main Topics  . Smoking status: Never Smoker   . Smokeless tobacco: Never Used  . Alcohol Use: None  . Drug Use: None  . Sexual Activity: Not Asked   Other Topics Concern  . None   Social History Narrative    History reviewed. No pertinent family history.  Review of Systems  Constitutional: Negative for fever, chills, appetite change and unexpected weight change.  HENT: Positive for sneezing. Negative for hearing  loss.   Eyes: Positive for visual disturbance (sometimes).  Respiratory: Positive for cough (sometimes) and wheezing. Negative for shortness of breath.   Cardiovascular: Negative for chest pain, palpitations and leg swelling.  Gastrointestinal: Positive for nausea (sometimes), abdominal pain (related to gas) and constipation. Negative for diarrhea and blood in stool.       GERD daily  Musculoskeletal: Positive for back pain (tail bone is sore). Negative for myalgias and arthralgias.  Neurological: Positive for weakness (legs and knees). Negative for dizziness, light-headedness, numbness and headaches.  Psychiatric/Behavioral: Negative for dysphoric  mood. The patient is not nervous/anxious.        Objective:   Filed Vitals:   02/09/15 1410  BP: 140/94  Pulse: 116  Temp: 98.3 F (36.8 C)  Resp: 18   Filed Weights   There is no weight on file to calculate BMI.   Physical Exam Constitutional: Appears well-developed and well-nourished. No distress.  Neck: Neck supple. No tracheal deviation present. No thyromegaly present.  No carotid bruit. No cervical adenopathy.   Cardiovascular: Normal rate, regular rhythm and normal heart sounds.   No murmur heard.  trace edema Pulmonary/Chest: Effort normal and breath sounds normal. No respiratory distress. No wheezes.  NEURO: strength in UE 4/5, strength in LE 3/5-4/5, did not have him get up from a chair or ambulate      Assessment & Plan:   See Problem List for Assessment and Plan of chronic medical problems.  Screening blood work today Flu shot today  Follow-up in 3 months if he is able to get her safely, if not we will need to reschedule.

## 2015-02-09 NOTE — Progress Notes (Signed)
Pre visit review using our clinic review tool, if applicable. No additional management support is needed unless otherwise documented below in the visit note. 

## 2015-02-09 NOTE — Patient Instructions (Addendum)
  We have reviewed your prior records including labs and tests today.  Test(s) ordered today. Your results will be released to MyChart (or called to you) after review, usually within 72hours after test completion. If any changes need to be made, you will be notified at that same time.  All other Health Maintenance issues reviewed.   Flu vaccine administered today.   Medications reviewed and updated.  No changes recommended at this time.  A referral to physical therapy was ordered -someone will contact you regarding this.    Please schedule followup in 3 months

## 2015-02-10 ENCOUNTER — Encounter: Payer: Self-pay | Admitting: Internal Medicine

## 2015-02-10 DIAGNOSIS — IMO0001 Reserved for inherently not codable concepts without codable children: Secondary | ICD-10-CM | POA: Insufficient documentation

## 2015-02-10 DIAGNOSIS — R030 Elevated blood-pressure reading, without diagnosis of hypertension: Secondary | ICD-10-CM

## 2015-02-10 DIAGNOSIS — R531 Weakness: Secondary | ICD-10-CM | POA: Insufficient documentation

## 2015-02-10 DIAGNOSIS — R5381 Other malaise: Secondary | ICD-10-CM | POA: Insufficient documentation

## 2015-02-10 NOTE — Assessment & Plan Note (Signed)
His blood pressure is slightly elevated here today. They do not check it regularly. We will recheck it at his next visit-hopefully physical therapy will check it as well when they visit him. It was quite an effort for him to come here today physically and most likely that is the cause of his elevated blood pressure, but he may also have undiagnosed hypertension

## 2015-02-10 NOTE — Assessment & Plan Note (Signed)
History of hyperlipidemia Will check fasting lipid panel

## 2015-02-10 NOTE — Assessment & Plan Note (Signed)
Following with urology-medication management. 

## 2015-02-10 NOTE — Assessment & Plan Note (Signed)
Significant in nature. Sounds like it is secondary to being bedbound from severe pain due to interstitial cystitis and he has just remained very sedentary Requires physical therapy-he is homebound and I will order home physical therapy Stressed that he must get out of bed and can no longer eat in bed 

## 2015-02-10 NOTE — Assessment & Plan Note (Signed)
Following with urology-medication management.

## 2015-02-10 NOTE — Assessment & Plan Note (Signed)
Significant in nature. Sounds like it is secondary to being bedbound from severe pain due to interstitial cystitis and he has just remained very sedentary Requires physical therapy-he is homebound and I will order home physical therapy Stressed that he must get out of bed and can no longer eat in bed

## 2015-02-10 NOTE — Assessment & Plan Note (Signed)
History of hyperglycemia ?Check A1c ?

## 2015-02-11 ENCOUNTER — Other Ambulatory Visit: Payer: Self-pay | Admitting: Internal Medicine

## 2015-02-11 DIAGNOSIS — D751 Secondary polycythemia: Secondary | ICD-10-CM

## 2015-02-12 ENCOUNTER — Telehealth: Payer: Self-pay | Admitting: Internal Medicine

## 2015-02-12 ENCOUNTER — Encounter: Payer: Self-pay | Admitting: Emergency Medicine

## 2015-02-12 NOTE — Telephone Encounter (Signed)
Yes

## 2015-02-12 NOTE — Telephone Encounter (Signed)
Has received referral for patient.  Needs to know if Lawerance Bach will be attending physician.

## 2015-02-12 NOTE — Telephone Encounter (Signed)
Okay with being the attending physician?

## 2015-02-13 DIAGNOSIS — I10 Essential (primary) hypertension: Secondary | ICD-10-CM | POA: Diagnosis not present

## 2015-02-13 DIAGNOSIS — R531 Weakness: Secondary | ICD-10-CM | POA: Diagnosis not present

## 2015-02-13 NOTE — Telephone Encounter (Signed)
Informed Amia with [facility]

## 2015-02-14 DIAGNOSIS — R531 Weakness: Secondary | ICD-10-CM | POA: Diagnosis not present

## 2015-02-14 DIAGNOSIS — I10 Essential (primary) hypertension: Secondary | ICD-10-CM | POA: Diagnosis not present

## 2015-02-16 ENCOUNTER — Telehealth: Payer: Self-pay | Admitting: Internal Medicine

## 2015-02-16 NOTE — Telephone Encounter (Signed)
Cody [name] with [facility] requesting verbal orders for OT for  dynamic balance, upper body strength and shower transfers 1 x 1 wk 2 x 1 wk  1 x 1 wk  She can be reached at [phone]

## 2015-02-16 NOTE — Telephone Encounter (Signed)
ok 

## 2015-02-16 NOTE — Telephone Encounter (Signed)
LVM giving verbals for OT

## 2015-02-19 DIAGNOSIS — R531 Weakness: Secondary | ICD-10-CM | POA: Diagnosis not present

## 2015-02-19 DIAGNOSIS — I10 Essential (primary) hypertension: Secondary | ICD-10-CM | POA: Diagnosis not present

## 2015-02-20 DIAGNOSIS — I10 Essential (primary) hypertension: Secondary | ICD-10-CM | POA: Diagnosis not present

## 2015-02-20 DIAGNOSIS — R531 Weakness: Secondary | ICD-10-CM | POA: Diagnosis not present

## 2015-02-21 DIAGNOSIS — R531 Weakness: Secondary | ICD-10-CM | POA: Diagnosis not present

## 2015-02-21 DIAGNOSIS — I10 Essential (primary) hypertension: Secondary | ICD-10-CM | POA: Diagnosis not present

## 2015-02-23 DIAGNOSIS — I10 Essential (primary) hypertension: Secondary | ICD-10-CM | POA: Diagnosis not present

## 2015-02-23 DIAGNOSIS — R531 Weakness: Secondary | ICD-10-CM | POA: Diagnosis not present

## 2015-02-26 DIAGNOSIS — R531 Weakness: Secondary | ICD-10-CM | POA: Diagnosis not present

## 2015-02-26 DIAGNOSIS — I10 Essential (primary) hypertension: Secondary | ICD-10-CM | POA: Diagnosis not present

## 2015-02-27 DIAGNOSIS — R531 Weakness: Secondary | ICD-10-CM | POA: Diagnosis not present

## 2015-02-27 DIAGNOSIS — I10 Essential (primary) hypertension: Secondary | ICD-10-CM | POA: Diagnosis not present

## 2015-03-01 DIAGNOSIS — R531 Weakness: Secondary | ICD-10-CM | POA: Diagnosis not present

## 2015-03-01 DIAGNOSIS — I10 Essential (primary) hypertension: Secondary | ICD-10-CM | POA: Diagnosis not present

## 2015-03-02 DIAGNOSIS — R531 Weakness: Secondary | ICD-10-CM | POA: Diagnosis not present

## 2015-03-02 DIAGNOSIS — I10 Essential (primary) hypertension: Secondary | ICD-10-CM | POA: Diagnosis not present

## 2015-03-05 DIAGNOSIS — R531 Weakness: Secondary | ICD-10-CM | POA: Diagnosis not present

## 2015-03-05 DIAGNOSIS — I10 Essential (primary) hypertension: Secondary | ICD-10-CM | POA: Diagnosis not present

## 2015-03-06 DIAGNOSIS — R531 Weakness: Secondary | ICD-10-CM | POA: Diagnosis not present

## 2015-03-06 DIAGNOSIS — I10 Essential (primary) hypertension: Secondary | ICD-10-CM | POA: Diagnosis not present

## 2015-03-07 DIAGNOSIS — I10 Essential (primary) hypertension: Secondary | ICD-10-CM | POA: Diagnosis not present

## 2015-03-07 DIAGNOSIS — R531 Weakness: Secondary | ICD-10-CM | POA: Diagnosis not present

## 2015-03-08 ENCOUNTER — Telehealth: Payer: Self-pay | Admitting: Emergency Medicine

## 2015-03-08 ENCOUNTER — Telehealth: Payer: Self-pay

## 2015-03-08 DIAGNOSIS — R531 Weakness: Secondary | ICD-10-CM | POA: Diagnosis not present

## 2015-03-08 NOTE — Telephone Encounter (Signed)
LVM with HH informing of MDs response.

## 2015-03-08 NOTE — Telephone Encounter (Signed)
Mya from [facility] called to request verbal orders for pt to have two more weeks of HH with 2 visits per week. Please advise.

## 2015-03-08 NOTE — Telephone Encounter (Signed)
Home Health Cert/Plan of Care received (02/13/2015 - 04/13/2015) signed. Faxed, copy sent to scan

## 2015-03-08 NOTE — Telephone Encounter (Signed)
Call back [phone]. She would also like to add shower & bathing strategies to his HH orders.

## 2015-03-08 NOTE — Telephone Encounter (Signed)
ok 

## 2015-03-09 DIAGNOSIS — R531 Weakness: Secondary | ICD-10-CM | POA: Diagnosis not present

## 2015-03-09 DIAGNOSIS — I10 Essential (primary) hypertension: Secondary | ICD-10-CM | POA: Diagnosis not present

## 2015-03-14 DIAGNOSIS — R531 Weakness: Secondary | ICD-10-CM | POA: Diagnosis not present

## 2015-03-14 DIAGNOSIS — I10 Essential (primary) hypertension: Secondary | ICD-10-CM | POA: Diagnosis not present

## 2015-03-15 DIAGNOSIS — R531 Weakness: Secondary | ICD-10-CM | POA: Diagnosis not present

## 2015-03-15 DIAGNOSIS — I10 Essential (primary) hypertension: Secondary | ICD-10-CM | POA: Diagnosis not present

## 2015-03-16 DIAGNOSIS — R531 Weakness: Secondary | ICD-10-CM | POA: Diagnosis not present

## 2015-03-16 DIAGNOSIS — I10 Essential (primary) hypertension: Secondary | ICD-10-CM | POA: Diagnosis not present

## 2015-03-19 DIAGNOSIS — R531 Weakness: Secondary | ICD-10-CM | POA: Diagnosis not present

## 2015-03-19 DIAGNOSIS — I10 Essential (primary) hypertension: Secondary | ICD-10-CM | POA: Diagnosis not present

## 2015-03-21 DIAGNOSIS — R531 Weakness: Secondary | ICD-10-CM | POA: Diagnosis not present

## 2015-03-21 DIAGNOSIS — I10 Essential (primary) hypertension: Secondary | ICD-10-CM | POA: Diagnosis not present

## 2015-12-11 DIAGNOSIS — H2513 Age-related nuclear cataract, bilateral: Secondary | ICD-10-CM | POA: Diagnosis not present

## 2016-01-16 DIAGNOSIS — N3010 Interstitial cystitis (chronic) without hematuria: Secondary | ICD-10-CM | POA: Diagnosis not present

## 2016-01-28 ENCOUNTER — Encounter: Payer: Self-pay | Admitting: Internal Medicine

## 2016-02-18 DIAGNOSIS — H25812 Combined forms of age-related cataract, left eye: Secondary | ICD-10-CM | POA: Diagnosis not present

## 2016-02-18 DIAGNOSIS — H25811 Combined forms of age-related cataract, right eye: Secondary | ICD-10-CM | POA: Diagnosis not present

## 2016-02-18 DIAGNOSIS — H02831 Dermatochalasis of right upper eyelid: Secondary | ICD-10-CM | POA: Diagnosis not present

## 2016-02-28 DIAGNOSIS — H2511 Age-related nuclear cataract, right eye: Secondary | ICD-10-CM | POA: Diagnosis not present

## 2016-02-28 DIAGNOSIS — H25811 Combined forms of age-related cataract, right eye: Secondary | ICD-10-CM | POA: Diagnosis not present

## 2016-03-06 DIAGNOSIS — H2511 Age-related nuclear cataract, right eye: Secondary | ICD-10-CM | POA: Diagnosis not present

## 2016-03-20 DIAGNOSIS — H25812 Combined forms of age-related cataract, left eye: Secondary | ICD-10-CM | POA: Diagnosis not present

## 2016-03-20 DIAGNOSIS — H2512 Age-related nuclear cataract, left eye: Secondary | ICD-10-CM | POA: Diagnosis not present

## 2016-03-31 ENCOUNTER — Inpatient Hospital Stay (HOSPITAL_COMMUNITY)
Admission: EM | Admit: 2016-03-31 | Discharge: 2016-04-02 | DRG: 066 | Disposition: A | Discharge status: Home Health | Payer: Medicare Other | Attending: Family Medicine | Admitting: Family Medicine

## 2016-03-31 ENCOUNTER — Emergency Department (HOSPITAL_COMMUNITY): Payer: Medicare Other

## 2016-03-31 ENCOUNTER — Encounter (HOSPITAL_COMMUNITY): Payer: Self-pay

## 2016-03-31 DIAGNOSIS — R310 Gross hematuria: Secondary | ICD-10-CM | POA: Diagnosis not present

## 2016-03-31 DIAGNOSIS — R4702 Dysphasia: Secondary | ICD-10-CM | POA: Diagnosis not present

## 2016-03-31 DIAGNOSIS — N3010 Interstitial cystitis (chronic) without hematuria: Secondary | ICD-10-CM | POA: Diagnosis not present

## 2016-03-31 DIAGNOSIS — I6789 Other cerebrovascular disease: Secondary | ICD-10-CM | POA: Diagnosis not present

## 2016-03-31 DIAGNOSIS — Z79899 Other long term (current) drug therapy: Secondary | ICD-10-CM | POA: Diagnosis not present

## 2016-03-31 DIAGNOSIS — Z8673 Personal history of transient ischemic attack (TIA), and cerebral infarction without residual deficits: Secondary | ICD-10-CM | POA: Diagnosis present

## 2016-03-31 DIAGNOSIS — I119 Hypertensive heart disease without heart failure: Secondary | ICD-10-CM | POA: Diagnosis present

## 2016-03-31 DIAGNOSIS — E785 Hyperlipidemia, unspecified: Secondary | ICD-10-CM | POA: Diagnosis present

## 2016-03-31 DIAGNOSIS — R9431 Abnormal electrocardiogram [ECG] [EKG]: Secondary | ICD-10-CM | POA: Diagnosis not present

## 2016-03-31 DIAGNOSIS — I6300 Cerebral infarction due to thrombosis of unspecified precerebral artery: Secondary | ICD-10-CM | POA: Diagnosis not present

## 2016-03-31 DIAGNOSIS — R29700 NIHSS score 0: Secondary | ICD-10-CM | POA: Diagnosis present

## 2016-03-31 DIAGNOSIS — I639 Cerebral infarction, unspecified: Secondary | ICD-10-CM | POA: Diagnosis not present

## 2016-03-31 DIAGNOSIS — R4701 Aphasia: Secondary | ICD-10-CM | POA: Diagnosis present

## 2016-03-31 DIAGNOSIS — R000 Tachycardia, unspecified: Secondary | ICD-10-CM

## 2016-03-31 HISTORY — DX: Interstitial cystitis (chronic) without hematuria: N30.10

## 2016-03-31 LAB — I-STAT TROPONIN, ED: Troponin i, poc: 0 ng/mL (ref 0.00–0.08)

## 2016-03-31 LAB — COMPREHENSIVE METABOLIC PANEL
ALBUMIN: 4 g/dL (ref 3.5–5.0)
ALT: 16 U/L — ABNORMAL LOW (ref 17–63)
AST: 21 U/L (ref 15–41)
Alkaline Phosphatase: 75 U/L (ref 38–126)
Anion gap: 11 (ref 5–15)
BILIRUBIN TOTAL: 0.6 mg/dL (ref 0.3–1.2)
BUN: 11 mg/dL (ref 6–20)
CHLORIDE: 102 mmol/L (ref 101–111)
CO2: 27 mmol/L (ref 22–32)
Calcium: 9 mg/dL (ref 8.9–10.3)
Creatinine, Ser: 1.02 mg/dL (ref 0.61–1.24)
GFR calc Af Amer: >60 mL/min (ref >60)
GFR calc non Af Amer: >60 mL/min (ref >60)
GLUCOSE: 102 mg/dL — AB (ref 65–99)
POTASSIUM: 4 mmol/L (ref 3.5–5.1)
SODIUM: 140 mmol/L (ref 135–145)
Total Protein: 6.7 g/dL (ref 6.5–8.1)

## 2016-03-31 LAB — RAPID URINE DRUG SCREEN, HOSP PERFORMED
AMPHETAMINES: NOT DETECTED
BARBITURATES: NOT DETECTED
BENZODIAZEPINES: NOT DETECTED
Cocaine: NOT DETECTED
OPIATES: NOT DETECTED
TETRAHYDROCANNABINOL: NOT DETECTED

## 2016-03-31 LAB — I-STAT CHEM 8, ED
BUN: 11 mg/dL (ref 6–20)
CREATININE: 0.9 mg/dL (ref 0.61–1.24)
Calcium, Ion: 1.09 mmol/L — ABNORMAL LOW (ref 1.15–1.40)
Chloride: 101 mmol/L (ref 101–111)
Glucose, Bld: 101 mg/dL — ABNORMAL HIGH (ref 65–99)
HCT: 54 % — ABNORMAL HIGH (ref 39.0–52.0)
HEMOGLOBIN: 18.4 g/dL — AB (ref 13.0–17.0)
POTASSIUM: 3.9 mmol/L (ref 3.5–5.1)
Sodium: 141 mmol/L (ref 135–145)
TCO2: 29 mmol/L (ref 0–100)

## 2016-03-31 LAB — CBG MONITORING, ED: Glucose-Capillary: 99 mg/dL (ref 65–99)

## 2016-03-31 LAB — DIFFERENTIAL
BASOS ABS: 0.1 10*3/uL (ref 0.0–0.1)
Basophils Relative: 1 %
EOS ABS: 0.1 10*3/uL (ref 0.0–0.7)
Eosinophils Relative: 1 %
LYMPHS ABS: 1.9 10*3/uL (ref 0.7–4.0)
Lymphocytes Relative: 20 %
Monocytes Absolute: 0.9 10*3/uL (ref 0.1–1.0)
Monocytes Relative: 9 %
NEUTROS PCT: 69 %
Neutro Abs: 6.6 10*3/uL (ref 1.7–7.7)

## 2016-03-31 LAB — URINALYSIS, ROUTINE W REFLEX MICROSCOPIC
Bilirubin Urine: NEGATIVE
Glucose, UA: NEGATIVE mg/dL
HGB URINE DIPSTICK: NEGATIVE
KETONES UR: NEGATIVE mg/dL
Leukocytes, UA: NEGATIVE
Nitrite: NEGATIVE
PH: 8 (ref 5.0–8.0)
PROTEIN: NEGATIVE mg/dL
Specific Gravity, Urine: 1.013 (ref 1.005–1.030)

## 2016-03-31 LAB — PROTIME-INR
INR: 1.05
Prothrombin Time: 13.7 seconds (ref 11.4–15.2)

## 2016-03-31 LAB — CBC
HCT: 51.1 % (ref 39.0–52.0)
HEMOGLOBIN: 17.5 g/dL — AB (ref 13.0–17.0)
MCH: 29.8 pg (ref 26.0–34.0)
MCHC: 34.2 g/dL (ref 30.0–36.0)
MCV: 87.1 fL (ref 78.0–100.0)
PLATELETS: 186 10*3/uL (ref 150–400)
RBC: 5.87 MIL/uL — AB (ref 4.22–5.81)
RDW: 13.4 % (ref 11.5–15.5)
WBC: 9.5 10*3/uL (ref 4.0–10.5)

## 2016-03-31 LAB — APTT: APTT: 37 s — AB (ref 24–36)

## 2016-03-31 LAB — ETHANOL: Alcohol, Ethyl (B): <5 mg/dL (ref <5)

## 2016-03-31 MED ORDER — LIDOCAINE HCL 2 % EX GEL
1.0000 "application " | Freq: Once | CUTANEOUS | Status: DC
  Filled 2016-03-31: qty 20

## 2016-03-31 MED ORDER — PENTOSAN POLYSULFATE SODIUM 100 MG PO CAPS
100.0000 mg | ORAL_CAPSULE | Freq: Every evening | ORAL | Status: DC
  Filled 2016-03-31 (×4): qty 1

## 2016-03-31 MED ORDER — ENOXAPARIN SODIUM 40 MG/0.4ML ~~LOC~~ SOLN
40.0000 mg | Freq: Every day | SUBCUTANEOUS | Status: DC
  Administered 2016-04-02: 40 mg via SUBCUTANEOUS
  Filled 2016-03-31 (×2): qty 0.4

## 2016-03-31 MED ORDER — SENNOSIDES-DOCUSATE SODIUM 8.6-50 MG PO TABS
1.0000 | ORAL_TABLET | Freq: Every evening | ORAL | Status: DC | PRN
  Filled 2016-03-31: qty 1

## 2016-03-31 MED ORDER — ZOLPIDEM TARTRATE 5 MG PO TABS: 5.0000 mg | ORAL_TABLET | Freq: Every evening | ORAL | Status: DC | PRN

## 2016-03-31 MED ORDER — ACETAMINOPHEN 160 MG/5ML PO SOLN: 650.0000 mg | ORAL | Status: DC | PRN

## 2016-03-31 MED ORDER — PHENAZOPYRIDINE HCL 100 MG PO TABS
95.0000 mg | ORAL_TABLET | Freq: Once | ORAL | Status: AC
  Administered 2016-03-31: 100 mg via ORAL
  Filled 2016-03-31: qty 1

## 2016-03-31 MED ORDER — TAMSULOSIN HCL 0.4 MG PO CAPS
0.4000 mg | ORAL_CAPSULE | Freq: Every day | ORAL | Status: DC
  Administered 2016-04-01: 0.4 mg via ORAL
  Filled 2016-03-31: qty 1

## 2016-03-31 MED ORDER — STROKE: EARLY STAGES OF RECOVERY BOOK
Freq: Once | Status: DC
  Filled 2016-03-31: qty 1

## 2016-03-31 MED ORDER — ADULT MULTIVITAMIN W/MINERALS CH
2.0000 | ORAL_TABLET | Freq: Every evening | ORAL | Status: DC
  Administered 2016-04-01: 2 via ORAL
  Filled 2016-03-31: qty 2

## 2016-03-31 MED ORDER — DOCUSATE SODIUM 100 MG PO CAPS
100.0000 mg | ORAL_CAPSULE | Freq: Every evening | ORAL | Status: DC
  Administered 2016-04-01: 100 mg via ORAL
  Filled 2016-03-31: qty 1

## 2016-03-31 MED ORDER — HYDROXYZINE HCL 25 MG PO TABS: 12.5000 mg | ORAL_TABLET | ORAL | Status: DC

## 2016-03-31 MED ORDER — ACETAMINOPHEN 325 MG PO TABS: 650.0000 mg | ORAL_TABLET | ORAL | Status: DC | PRN

## 2016-03-31 MED ORDER — ASPIRIN 81 MG PO CHEW
81.0000 mg | CHEWABLE_TABLET | Freq: Every day | ORAL | Status: DC
  Administered 2016-03-31: 81 mg via ORAL
  Filled 2016-03-31: qty 1

## 2016-03-31 MED ORDER — DARIFENACIN HYDROBROMIDE ER 7.5 MG PO TB24
7.5000 mg | ORAL_TABLET | Freq: Every day | ORAL | Status: DC
  Administered 2016-04-02: 7.5 mg via ORAL
  Filled 2016-03-31 (×2): qty 1

## 2016-03-31 MED ORDER — ATORVASTATIN CALCIUM 80 MG PO TABS
80.0000 mg | ORAL_TABLET | Freq: Every day | ORAL | Status: DC
  Administered 2016-04-01: 80 mg via ORAL
  Filled 2016-03-31 (×2): qty 1

## 2016-03-31 MED ORDER — ACETAMINOPHEN 650 MG RE SUPP: 650.0000 mg | RECTAL | Status: DC | PRN

## 2016-03-31 NOTE — ED Notes (Signed)
Pt stating he has to urinate, pt already urinated twice within the last hour. Bladder scanned pt and pt had 27 mL, pt atempting to climb out of bed. Alerted physician

## 2016-03-31 NOTE — H&P (Signed)
History and Physical    Cody [name] ZOX:[id number] DOB: 07/23/1940 DOA: 03/31/2016  Referring MD/NP/PA:   PCP: Pincus Sanes, MD   Patient coming from:  The patient is coming from home.  At baseline, pt is independent for most of ADL.  Chief Complaint: Difficulty speaking and the right hand numbness  HPI: Cody [name] is a 76 y.o. male with medical history significant of interstitial cystitis, scarlet fever, whooping cough, who presents with difficulty speaking and right hand numbness.  Per pt's wife, he was last known normal at about 11:00 PM last night. His wife reports that he got up at about 6:00 this morning to go to the bathroom and complained of some numbness in his right hand. Pt also has difficulty getting the words out. His wife reports that his language was nonsensical at times. His difficulty spacing has improved, but he still has persistent right hand numbness. He denies any vision changes, hearing loss, weakness in extremities, difficulty swallowing, dizziness. Patient does not have chest pain, shortness rest, cough, fever, chills, nausea, vomiting, diarrhea, symptoms of UTI.  ED Course: pt was found to have negative UDS, WBC 9.5, negative urinalysis, INR 1.05, electrolytes renal function okay, blood pressure elevated to 209/104-->165/111. chest x-ray showed stable right hemidiaphragm elevation. CT head is negative. MRI of brain and MRA of head showed two discrete punctate (2 millimeter) foci of acute ischemia within the left frontoparietal white matter, with the slightly larger lesion at the base central sulcus. No intracranial arterial occlusion or high-grade stenosis. Pt is admitted to tele bed as inpt. Neurology, Dr. Roxy Manns was consulted.  Review of Systems:   General: no fevers, chills, no changes in body weight HEENT: no blurry vision, hearing changes or sore throat Respiratory: no dyspnea, coughing, wheezing CV: no chest pain, no palpitations GI: no nausea, vomiting,  abdominal pain, diarrhea, constipation GU: no dysuria, burning on urination, increased urinary frequency, hematuria  Ext: no leg edema Neuro: has right hand numbness, no vision change or hearing loss Skin: no rash, no skin tear. MSK: No muscle spasm, no deformity, no limitation of range of movement in spin Heme: No easy bruising.  Travel history: No recent long distant travel.  Allergy: No Known Allergies  Past Medical History:  Diagnosis Date  . Interstitial cystitis   . Scarlet fever 11/09/2007   Annotation: as a child, no sequellae Qualifier: History of  By: Debby Bud MD, Rosalyn Gess WHOOPING COUGH 11/10/2007   Qualifier: History of  By: Debby Bud MD, Rosalyn Gess     Past Surgical History:  Procedure Laterality Date  . CYSTOSTOMY W/ BLADDER BIOPSY     2  . INGUINAL HERNIA REPAIR Bilateral   . KNEE ARTHROSCOPY Left   . TONSILLECTOMY  1948    Social History:  reports that he has never smoked. He has never used smokeless tobacco. He reports that he does not use drugs. His alcohol history is not on file.  Family History:  Family History  Problem Relation Age of Onset  . Colon cancer Mother   . Alcohol abuse Father      Prior to Admission medications   Medication Sig Start Date End Date Taking? Authorizing Provider  docusate sodium (COLACE) 100 MG capsule Take 100 mg by mouth every evening.   Yes Historical Provider, MD  hydrOXYzine (ATARAX/VISTARIL) 25 MG tablet Take 12.5 mg by mouth every other day.   Yes Historical Provider, MD  Multiple Vitamins-Minerals (ADULT ONE DAILY GUMMIES) CHEW Chew  2 each by mouth every evening.    Yes Historical Provider, MD  pentosan polysulfate (ELMIRON) 100 MG capsule Take 2 capsules (200 mg total) by mouth daily. Patient taking differently: Take 100 mg by mouth every evening.  08/12/12  Yes Jacques Navy, MD  solifenacin (VESICARE) 10 MG tablet Take 10 mg by mouth every evening.   Yes Historical Provider, MD  tamsulosin (FLOMAX) 0.4 MG CAPS  Take 1 capsule (0.4 mg total) by mouth daily. Patient taking differently: Take 0.4 mg by mouth daily after supper.  08/12/12  Yes Jacques Navy, MD    Physical Exam: Vitals:   03/31/16 1915 03/31/16 2202 03/31/16 2215 03/31/16 2245  BP: (!) 171/92 (!) 165/111 (!) 160/102 (!) 183/95  Pulse: (!) 114 87 88 93  Resp: (!) 30 19 (!) 31 (!) 25  Temp:      TempSrc:      SpO2: 92% 95% 92% 95%  Weight:       General: Not in acute distress HEENT:       Eyes: PERRL, EOMI, no scleral icterus.       ENT: No discharge from the ears and nose, no pharynx injection, no tonsillar enlargement.        Neck: No JVD, no bruit, no mass felt. Heme: No neck lymph node enlargement. Cardiac: S1/S2, RRR, No murmurs, No gallops or rubs. Respiratory: Good air movement bilaterally. No rales, wheezing, rhonchi or rubs. GI: Soft, nondistended, nontender, no rebound pain, no organomegaly, BS present. GU: No hematuria Ext: No pitting leg edema bilaterally. 2+DP/PT pulse bilaterally. Musculoskeletal: No joint deformities, No joint redness or warmth, no limitation of ROM in spin. Skin: No rashes.  Neuro: Alert, oriented X3, cranial nerves II-XII grossly intact. Muscle strength 5/5 in all extremities, sensation to light touch is decreased in right hand. Brachial reflex 2+ bilaterally. Negative Babinski's sign. Normal finger to nose test. Psych: Patient is not psychotic, no suicidal or hemocidal ideation.  Labs on Admission: I have personally reviewed following labs and imaging studies  CBC:  Recent Labs Lab 03/31/16 1521 03/31/16 1545  WBC 9.5  --   NEUTROABS 6.6  --   HGB 17.5* 18.4*  HCT 51.1 54.0*  MCV 87.1  --   PLT 186  --    Basic Metabolic Panel:  Recent Labs Lab 03/31/16 1521 03/31/16 1545  NA 140 141  K 4.0 3.9  CL 102 101  CO2 27  --   GLUCOSE 102* 101*  BUN 11 11  CREATININE 1.02 0.90  CALCIUM 9.0  --    GFR: CrCl cannot be calculated (Unknown ideal weight.). Liver Function  Tests:  Recent Labs Lab 03/31/16 1521  AST 21  ALT 16*  ALKPHOS 75  BILITOT 0.6  PROT 6.7  ALBUMIN 4.0   No results for input(s): LIPASE, AMYLASE in the last 168 hours. No results for input(s): AMMONIA in the last 168 hours. Coagulation Profile:  Recent Labs Lab 03/31/16 1521  INR 1.05   Cardiac Enzymes: No results for input(s): CKTOTAL, CKMB, CKMBINDEX, TROPONINI in the last 168 hours. BNP (last 3 results) No results for input(s): PROBNP in the last 8760 hours. HbA1C: No results for input(s): HGBA1C in the last 72 hours. CBG:  Recent Labs Lab 03/31/16 1523  GLUCAP 99   Lipid Profile: No results for input(s): CHOL, HDL, LDLCALC, TRIG, CHOLHDL, LDLDIRECT in the last 72 hours. Thyroid Function Tests: No results for input(s): TSH, T4TOTAL, FREET4, T3FREE, THYROIDAB in the last 72 hours.  Anemia Panel: No results for input(s): VITAMINB12, FOLATE, FERRITIN, TIBC, IRON, RETICCTPCT in the last 72 hours. Urine analysis:    Component Value Date/Time   COLORURINE YELLOW 03/31/2016 1527   APPEARANCEUR CLEAR 03/31/2016 1527   LABSPEC 1.013 03/31/2016 1527   PHURINE 8.0 03/31/2016 1527   GLUCOSEU NEGATIVE 03/31/2016 1527   GLUCOSEU NEGATIVE 10/06/2006 1617   HGBUR NEGATIVE 03/31/2016 1527   BILIRUBINUR NEGATIVE 03/31/2016 1527   KETONESUR NEGATIVE 03/31/2016 1527   PROTEINUR NEGATIVE 03/31/2016 1527   UROBILINOGEN 0.2 mg/dL 16/10/9602 5409   NITRITE NEGATIVE 03/31/2016 1527   LEUKOCYTESUR NEGATIVE 03/31/2016 1527   Sepsis Labs: @LABRCNTIP (procalcitonin:4,lacticidven:4) )No results found for this or any previous visit (from the past 240 hour(s)).   Radiological Exams on Admission: Ct Head Wo Contrast  Result Date: 03/31/2016 CLINICAL DATA:  Expressive aphasia.  Tachycardia. EXAM: CT HEAD WITHOUT CONTRAST TECHNIQUE: Contiguous axial images were obtained from the base of the skull through the vertex without intravenous contrast. COMPARISON:  None. FINDINGS: Brain: No  evidence of acute infarction, hemorrhage, extra-axial collection, ventriculomegaly, or mass effect. Generalized cerebral atrophy. Periventricular white matter low attenuation likely secondary to microangiopathy. Vascular: Cerebrovascular atherosclerotic calcifications are noted. Skull: Negative for fracture or focal lesion. Sinuses/Orbits: Visualized portions of the orbits are unremarkable. Visualized portions of the paranasal sinuses and mastoid air cells are unremarkable. Other: None. IMPRESSION: No acute intracranial pathology. Electronically Signed   By: Elige Ko   On: 03/31/2016 16:17   Mr Brain Wo Contrast  Result Date: 03/31/2016 CLINICAL DATA:  Expressive aphasia and right hand numbness. EXAM: MRI HEAD WITHOUT CONTRAST MRA HEAD WITHOUT CONTRAST TECHNIQUE: Multiplanar, multiecho pulse sequences of the brain and surrounding structures were obtained without intravenous contrast. Angiographic images of the head were obtained using MRA technique without contrast. COMPARISON:  Head CT same day FINDINGS: MRI HEAD FINDINGS Brain: There is a punctate focus of diffusion restriction within the left frontoparietal white matter at the bases central sulcus. There is a smaller punctate focus more posteriorly within the left parietal white matter. There is diffuse confluent hyperintense T2-weighted signal within the periventricular, deep and subcortical white matter, most often seen in the setting of chronic microvascular ischemia. No acute hemorrhage. No mass lesion or midline shift. No hydrocephalus or extra-axial fluid collection. The midline structures are normal. No age advanced or lobar predominant atrophy. Vascular: Major intracranial arterial and venous sinus flow voids are preserved. No evidence of chronic microhemorrhage or amyloid angiopathy. Skull and upper cervical spine: The visualized skull base, calvarium, upper cervical spine and extracranial soft tissues are normal. Sinuses/Orbits: No fluid levels  or advanced mucosal thickening. No mastoid effusion. Normal orbits. MRA HEAD FINDINGS Intracranial internal carotid arteries: Normal. Anterior cerebral arteries: Normal. Middle cerebral arteries: Normal. Posterior communicating arteries: Present bilaterally. 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 Basilar artery: Normal. Vertebral arteries: Left dominant. The right vertebral artery terminates in the posterior inferior cerebellar artery. Superior cerebellar arteries: Normal. Anterior inferior cerebellar arteries: Poorly visualized. Posterior inferior cerebellar arteries: Normal. IMPRESSION: 1. Two discrete punctate (2 millimeter) foci of acute ischemia within the left frontoparietal white matter, with the slightly larger lesion at the base central sulcus. This is in keeping with reported right upper extremity symptoms. 2. No hemorrhage or mass effect. 3. No intracranial arterial occlusion or high-grade stenosis. Electronically Signed   By: Deatra Robinson M.D.   On: 03/31/2016 22:23  Dg Chest Port 1 View  Result Date: 03/31/2016 CLINICAL DATA:  Tachycardia EXAM: PORTABLE CHEST 1 VIEW COMPARISON:  11/16/2007 FINDINGS: Elevation of the right hemidiaphragm is noted. The lungs are clear. No focal infiltrate or sizable effusion is seen. Cardiac shadow is within normal limits. No bony abnormality is seen. IMPRESSION: Elevation of the right hemidiaphragm which is stable from the prior exam. No other focal abnormality is noted. Electronically Signed   By: Alcide CleverMark  Lukens M.D.   On: 03/31/2016 17:50   Mr Maxine GlennMra Headm  Result Date: 03/31/2016 CLINICAL DATA:  Expressive aphasia and right hand numbness. EXAM: MRI HEAD WITHOUT CONTRAST MRA HEAD WITHOUT CONTRAST TECHNIQUE: Multiplanar, multiecho pulse sequences of the brain and surrounding structures were obtained without intravenous contrast.  Angiographic images of the head were obtained using MRA technique without contrast. COMPARISON:  Head CT same day FINDINGS: MRI HEAD FINDINGS Brain: There is a punctate focus of diffusion restriction within the left frontoparietal white matter at the bases central sulcus. There is a smaller punctate focus more posteriorly within the left parietal white matter. There is diffuse confluent hyperintense T2-weighted signal within the periventricular, deep and subcortical white matter, most often seen in the setting of chronic microvascular ischemia. No acute hemorrhage. No mass lesion or midline shift. No hydrocephalus or extra-axial fluid collection. The midline structures are normal. No age advanced or lobar predominant atrophy. Vascular: Major intracranial arterial and venous sinus flow voids are preserved. No evidence of chronic microhemorrhage or amyloid angiopathy. Skull and upper cervical spine: The visualized skull base, calvarium, upper cervical spine and extracranial soft tissues are normal. Sinuses/Orbits: No fluid levels or advanced mucosal thickening. No mastoid effusion. Normal orbits. MRA HEAD FINDINGS Intracranial internal carotid arteries: Normal. Anterior cerebral arteries: Normal. Middle cerebral arteries: Normal. Posterior communicating arteries: Present bilaterally. 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 Basilar artery: Normal. Vertebral arteries: Left dominant. The right vertebral artery terminates in the posterior inferior cerebellar artery. Superior cerebellar arteries: Normal. Anterior inferior cerebellar arteries: Poorly visualized. Posterior inferior cerebellar arteries: Normal. IMPRESSION: 1. Two discrete punctate (2 millimeter) foci of acute ischemia within the left frontoparietal white matter, with the slightly larger lesion at the base central sulcus. This is in  keeping with reported right upper extremity symptoms. 2. No hemorrhage or mass effect. 3. No intracranial arterial occlusion or high-grade stenosis. Electronically Signed   By: Deatra RobinsonKevin  Herman M.D.   On: 03/31/2016 22:23     EKG: Independently reviewed. Sinus rhythm, QTC 437, LAD, Q wave in lead III and aVF  Assessment/Plan Principal Problem:   Stroke (cerebrum) (HCC) Active Problems:   Hyperlipidemia   Interstitial cystitis   Stroke (cerebrum) [facility](HCC): MRI of brain and MRA of head showed two discrete punctate (2 millimeter) foci of acute ischemia within the left frontoparietal white matter, with the slightly larger lesion at the base central sulcus. No intracranial arterial occlusion or high-grade stenosis. Neurology, Dr. Roxy Mannsster,  was consulted-->recommended to completed stroke work up and antiplatelet therapy with aspirin 81 mg daily for secondary stroke prevention.  -will admit to tele bed as inpt -highly appreciate Dr. Catha Browster's consultation, will follow-up recommendations. - per Dr. Roxy Mannsster, will allow permissive hypertension in the acute phase, treating only SBP greater than 220 mmHg and/or DBP greater than 110 mmHg. Avoid hypotonic IVF to minimize exacerbation of post-stroke edema. -Check carotid dopplers  - Continue ASA 81 mg daily  -  fasting lipid panel and HbA1c  - 2D transthoracic echocardiography  - PT/OT consult  HLD: Last LDL was 115 on 02/09/15. Goal will be <70 -start lipitor 80 mg daily  -Check FLP  Hx of Interstitial cystitis: stable -continue Pentosan, viscare and flomax   DVT ppx: SQ Lovenox Code Status: Full code Family Communication: Yes, patient's wife at bed side Disposition Plan:  Anticipate discharge back to previous home environment Consults called: neurologist, Dr. Roxy Manns Admission status: Inpatient/tele     Date of Service 03/31/2016    Lorretta Harp [facility] Pager [phone]  If 7PM-7AM, please contact night-coverage [email or web address] Password  [facility] 03/31/2016, 11:03 PM

## 2016-03-31 NOTE — ED Notes (Signed)
Patient transported to MRI 

## 2016-03-31 NOTE — ED Triage Notes (Signed)
Pt. Coming from home via [facility] for altered mental status. Pt. Speech changes noted this morning by wife. Pt. Last seen normal last night 11pm. Pt. Having hard time getting words out and saying inappropriate words. EDP notified. Pt. Noted to have HTN, but family only reports only medical hx of interstitial cystitis. Pt. BP 185/111 en route.

## 2016-03-31 NOTE — ED Provider Notes (Signed)
[facility]-EMERGENCY DEPT Provider Note   CSN: [id number] Arrival date & time: 03/31/16  1455     History   Chief Complaint Chief Complaint  Patient presents with  . Altered Mental Status    HPI Cody [name] is a 76 y.o. male with a past medical history significant for interstitial cystitis who presents with aphasia. Patient is committed by family reports that last normal was last night around 10 or 11 PM. Patient will be this morning with difficulty speaking. Patient is not slurring his words but is answering appropriately, perseverating, and is speaking nonsensical answers to questions. Patient has no other complaints including no fevers, chills, chest pain, shortness of breath, palpitations, nausea, vomiting, diaphoresis, constipation, diarrhea, dysuria. Patient does report some mild frequency which is chronic. Patient denies any recent falls or injuries. Patient has no history of this.  Patient also does report that he had intermittent right arm numbness that has resolved. He noticed this this morning at approximately 6 AM   The history is provided by the patient, the spouse and a relative. No language interpreter was used.  Neurologic Problem  This is a new problem. The current episode started yesterday. The problem occurs constantly. The problem has not changed since onset.Pertinent negatives include no chest pain, no abdominal pain, no headaches and no shortness of breath. Nothing aggravates the symptoms. Nothing relieves the symptoms. He has tried nothing for the symptoms. The treatment provided no relief.    Past Medical History:  Diagnosis Date  . Interstitial cystitis   . Scarlet fever 11/09/2007   Annotation: as a child, no sequellae Qualifier: History of  By: Debby Bud MD, Rosalyn Gess WHOOPING COUGH 11/10/2007   Qualifier: History of  By: Debby Bud MD, Rosalyn Gess     Patient Active Problem List   Diagnosis Date Noted  . Physical deconditioning 02/10/2015  . Weakness  generalized 02/10/2015  . Elevated blood pressure 02/10/2015  . Hyperglycemia 02/09/2015  . ANAL OR RECTAL PAIN 11/09/2007  . Hyperlipidemia 04/09/2007  . HEMORRHOIDS 04/09/2007  . DIVERTICULOSIS, COLON 04/09/2007  . INTERSTITIAL CYSTITIS 04/09/2007  . BPH (benign prostatic hyperplasia) 04/09/2007    Past Surgical History:  Procedure Laterality Date  . CYSTOSTOMY W/ BLADDER BIOPSY     2  . INGUINAL HERNIA REPAIR Bilateral   . KNEE ARTHROSCOPY Left   . TONSILLECTOMY  1948       Home Medications    Prior to Admission medications   Medication Sig Start Date End Date Taking? Authorizing Provider  hydrOXYzine (ATARAX/VISTARIL) 10 MG tablet Take 0.5 tablets (5 mg total) by mouth daily. 08/12/12   Jacques Navy, MD  pentosan polysulfate (ELMIRON) 100 MG capsule Take 2 capsules (200 mg total) by mouth daily. Patient taking differently: Take 100 mg by mouth daily.  08/12/12   Jacques Navy, MD  solifenacin (VESICARE) 5 MG tablet Take 1 tablet (5 mg total) by mouth daily. 08/12/12   Jacques Navy, MD  tamsulosin (FLOMAX) 0.4 MG CAPS Take 1 capsule (0.4 mg total) by mouth daily. 08/12/12   Jacques Navy, MD    Family History Family History  Problem Relation Age of Onset  . Colon cancer Mother   . Alcohol abuse Father     Social History Social History  Substance Use Topics  . Smoking status: Never Smoker  . Smokeless tobacco: Never Used  . Alcohol use Not on file     Allergies   Patient has no known allergies.  Review of Systems Review of Systems  Constitutional: Negative for activity change, chills, diaphoresis, fatigue and fever.  HENT: Negative for congestion and rhinorrhea.   Eyes: Negative for visual disturbance.  Respiratory: Negative for cough, chest tightness, shortness of breath, wheezing and stridor.   Cardiovascular: Negative for chest pain, palpitations and leg swelling.  Gastrointestinal: Negative for abdominal distention, abdominal pain, blood  in stool, constipation, diarrhea, nausea and vomiting.  Genitourinary: Positive for frequency. Negative for difficulty urinating, dysuria and flank pain.  Musculoskeletal: Negative for back pain and gait problem.  Skin: Negative for rash and wound.  Neurological: Positive for speech difficulty and numbness. Negative for dizziness, tremors, seizures, syncope, weakness, light-headedness and headaches.  Psychiatric/Behavioral: Negative for agitation and confusion.  All other systems reviewed and are negative.    Physical Exam Updated Vital Signs BP (!) 198/99 (BP Location: Right Arm)   Pulse (!) 113   Temp 98.4 F (36.9 C) (Oral)   Resp 17   Wt 177 lb (80.3 kg)   SpO2 93%   BMI 26.91 kg/m   Physical Exam  Constitutional: He appears well-developed and well-nourished. No distress.  HENT:  Head: Normocephalic and atraumatic.  Right Ear: External ear normal.  Left Ear: External ear normal.  Nose: Nose normal.  Mouth/Throat: Oropharynx is clear and moist. No oropharyngeal exudate.  Eyes: Conjunctivae and EOM are normal. Pupils are equal, round, and reactive to light.  Neck: Normal range of motion. Neck supple.  Cardiovascular: Regular rhythm, normal heart sounds and intact distal pulses.  Tachycardia present.   No murmur heard. Pulmonary/Chest: Effort normal. No stridor. No respiratory distress. He has no wheezes. He exhibits no tenderness.  Abdominal: Soft. There is no tenderness. There is no rebound and no guarding.  Musculoskeletal: He exhibits no edema.  Neurological: He is alert. He is disoriented. He displays no tremor and normal reflexes. No cranial nerve deficit or sensory deficit. He exhibits normal muscle tone. Coordination normal. GCS eye subscore is 4. GCS verbal subscore is 5. GCS motor subscore is 6.  Patient disoriented to year. Patient has expressive aphasia. No facial droop or gaze preference.  Skin: Skin is warm. Capillary refill takes less than 2 seconds. No rash  noted. He is not diaphoretic. No erythema. No pallor.  Nursing note and vitals reviewed.    ED Treatments / Results  Labs (all labs ordered are listed, but only abnormal results are displayed) Labs Reviewed  APTT - Abnormal; Notable for the following:       Result Value   aPTT 37 (*)    All other components within normal limits  CBC - Abnormal; Notable for the following:    RBC 5.87 (*)    Hemoglobin 17.5 (*)    All other components within normal limits  COMPREHENSIVE METABOLIC PANEL - Abnormal; Notable for the following:    Glucose, Bld 102 (*)    ALT 16 (*)    All other components within normal limits  I-STAT CHEM 8, ED - Abnormal; Notable for the following:    Glucose, Bld 101 (*)    Calcium, Ion 1.09 (*)    Hemoglobin 18.4 (*)    HCT 54.0 (*)    All other components within normal limits  ETHANOL  PROTIME-INR  DIFFERENTIAL  RAPID URINE DRUG SCREEN, HOSP PERFORMED  URINALYSIS, ROUTINE W REFLEX MICROSCOPIC  HEMOGLOBIN A1C  LIPID PANEL  I-STAT TROPOININ, ED  CBG MONITORING, ED    EKG  EKG Interpretation  Date/Time:  Monday March 31 2016 14:55:46 EDT Ventricular Rate:  110 PR Interval:    QRS Duration: 93 QT Interval:  323 QTC Calculation: 437 R Axis:   -64 Text Interpretation:  Sinus tachycardia Inferior infarct, old When compared to prior, rate is faster. No other significant changes seen.  No STEMI Confirmed by [facility] MD, Davied Nocito [phone]) on 03/31/2016 4:28:34 PM       Radiology Ct Head Wo Contrast  Result Date: 03/31/2016 CLINICAL DATA:  Expressive aphasia.  Tachycardia. EXAM: CT HEAD WITHOUT CONTRAST TECHNIQUE: Contiguous axial images were obtained from the base of the skull through the vertex without intravenous contrast. COMPARISON:  None. FINDINGS: [name]: No evidence of acute infarction, hemorrhage, extra-axial collection, ventriculomegaly, or mass effect. Generalized cerebral atrophy. Periventricular white matter low attenuation likely secondary to  microangiopathy. Vascular: Cerebrovascular atherosclerotic calcifications are noted. Skull: Negative for fracture or focal lesion. Sinuses/Orbits: Visualized portions of the orbits are unremarkable. Visualized portions of the paranasal sinuses and mastoid air cells are unremarkable. Other: None. IMPRESSION: No acute intracranial pathology. Electronically Signed   By: Elige Ko   On: 03/31/2016 16:17   Cody [name] Wo Contrast  Result Date: 03/31/2016 CLINICAL DATA:  Expressive aphasia and right hand numbness. EXAM: MRI HEAD WITHOUT CONTRAST MRA HEAD WITHOUT CONTRAST TECHNIQUE: Multiplanar, multiecho pulse sequences of the [name] and surrounding structures were obtained without intravenous contrast. Angiographic images of the head were obtained using MRA technique without contrast. COMPARISON:  Head CT same day FINDINGS: MRI HEAD FINDINGS [name]: There is a punctate focus of diffusion restriction within the left frontoparietal white matter at the bases central sulcus. There is a smaller punctate focus more posteriorly within the left parietal white matter. There is diffuse confluent hyperintense T2-weighted signal within the periventricular, deep and subcortical white matter, most often seen in the setting of chronic microvascular ischemia. No acute hemorrhage. No mass lesion or midline shift. No hydrocephalus or extra-axial fluid collection. The midline structures are normal. No age advanced or lobar predominant atrophy. Vascular: Major intracranial arterial and venous sinus flow voids are preserved. No evidence of chronic microhemorrhage or amyloid angiopathy. Skull and upper cervical spine: The visualized skull base, calvarium, upper cervical spine and extracranial soft tissues are normal. Sinuses/Orbits: No fluid levels or advanced mucosal thickening. No mastoid effusion. Normal orbits. MRA HEAD FINDINGS Intracranial internal carotid arteries: Normal. Anterior cerebral arteries: Normal. Middle cerebral arteries:  Normal. Posterior communicating arteries: Present bilaterally. 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 Basilar artery: Normal. Vertebral arteries: Left dominant. The right vertebral artery terminates in the posterior inferior cerebellar artery. Superior cerebellar arteries: Normal. Anterior inferior cerebellar arteries: Poorly visualized. Posterior inferior cerebellar arteries: Normal. IMPRESSION: 1. Two discrete punctate (2 millimeter) foci of acute ischemia within the left frontoparietal white matter, with the slightly larger lesion at the base central sulcus. This is in keeping with reported right upper extremity symptoms. 2. No hemorrhage or mass effect. 3. No intracranial arterial occlusion or high-grade stenosis. Electronically Signed   By: Deatra Robinson M.D.   On: 03/31/2016 22:23   Dg Chest Port 1 View  Result Date: 03/31/2016 CLINICAL DATA:  Tachycardia EXAM: PORTABLE CHEST 1 VIEW COMPARISON:  11/16/2007 FINDINGS: Elevation of the right hemidiaphragm is noted. The lungs are clear. No focal infiltrate or sizable effusion is seen. Cardiac shadow is within normal limits. No bony abnormality is seen. IMPRESSION: Elevation of the right hemidiaphragm which is  stable from the prior exam. No other focal abnormality is noted. Electronically Signed   By: Alcide Clever M.D.   On: 03/31/2016 17:50   Cody Cody [name] Headm  Result Date: 03/31/2016 CLINICAL DATA:  Expressive aphasia and right hand numbness. EXAM: MRI HEAD WITHOUT CONTRAST MRA HEAD WITHOUT CONTRAST TECHNIQUE: Multiplanar, multiecho pulse sequences of the [name] and surrounding structures were obtained without intravenous contrast. Angiographic images of the head were obtained using MRA technique without contrast. COMPARISON:  Head CT same day FINDINGS: MRI HEAD FINDINGS [name]: There is a punctate focus of diffusion restriction within  the left frontoparietal white matter at the bases central sulcus. There is a smaller punctate focus more posteriorly within the left parietal white matter. There is diffuse confluent hyperintense T2-weighted signal within the periventricular, deep and subcortical white matter, most often seen in the setting of chronic microvascular ischemia. No acute hemorrhage. No mass lesion or midline shift. No hydrocephalus or extra-axial fluid collection. The midline structures are normal. No age advanced or lobar predominant atrophy. Vascular: Major intracranial arterial and venous sinus flow voids are preserved. No evidence of chronic microhemorrhage or amyloid angiopathy. Skull and upper cervical spine: The visualized skull base, calvarium, upper cervical spine and extracranial soft tissues are normal. Sinuses/Orbits: No fluid levels or advanced mucosal thickening. No mastoid effusion. Normal orbits. MRA HEAD FINDINGS Intracranial internal carotid arteries: Normal. Anterior cerebral arteries: Normal. Middle cerebral arteries: Normal. Posterior communicating arteries: Present bilaterally. 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 Basilar artery: Normal. Vertebral arteries: Left dominant. The right vertebral artery terminates in the posterior inferior cerebellar artery. Superior cerebellar arteries: Normal. Anterior inferior cerebellar arteries: Poorly visualized. Posterior inferior cerebellar arteries: Normal. IMPRESSION: 1. Two discrete punctate (2 millimeter) foci of acute ischemia within the left frontoparietal white matter, with the slightly larger lesion at the base central sulcus. This is in keeping with reported right upper extremity symptoms. 2. No hemorrhage or mass effect. 3. No intracranial arterial occlusion or high-grade stenosis. Electronically Signed   By: Deatra Robinson M.D.   On:  03/31/2016 22:23    Procedures Procedures (including critical care time)    Medications Ordered in ED Medications  lidocaine (XYLOCAINE) 2 % jelly 1 application (not administered)   stroke: mapping our early stages of recovery book (not administered)  aspirin chewable tablet 81 mg (81 mg Oral Given 03/31/16 1945)  atorvastatin (LIPITOR) tablet 80 mg (not administered)  phenazopyridine (PYRIDIUM) tablet 100 mg (100 mg Oral Given 03/31/16 1643)     Initial Impression / Assessment and Plan / ED Course  I have reviewed the triage vital signs and the nursing notes.  Pertinent labs & imaging results that were available during my care of the patient were reviewed by me and considered in my medical decision making (see chart for details).     GOTTLIEB ZUERCHER is a 76 y.o. male with a past medical history significant for interstitial cystitis who presents with aphasia.  History and exam are seen above.   On exam, patient has no focal neurologic deficits aside from expressive aphasia. Patient does not have dysarthria. Patient has clear lungs and nontender chest or abdomen. Patient has normal coordination, sensation, and grip strength. Patient denied diplopia vision changes. Gait testing was deferred at this time.  As patient was last normal last night, code stroke will not be called however, stroke workup was initiated.  Patient will have head CT, imaging, laboratory testing to look for occult infection electrolyte abnormality or other cause of symptoms. Anticipate speaking with neurology after imaging.  CT imaging unremarkable. Neurology called. They recommended MRI, MRA, and admission for further workup.  MRI shows evidence of acute stroke. Neurology will follow the patient and patient will be admitted to the hospitalist service.  Hospitalist team will admit and patient was admitted in stable condition.   Final Clinical Impressions(s) / ED Diagnoses   Final diagnoses:  Cerebrovascular  accident (CVA), unspecified mechanism (HCC)    Clinical Impression: 1. Cerebrovascular accident (CVA), unspecified mechanism (HCC)   2. Tachycardia   3. Acute ischemic stroke [facility](HCC)     Disposition: Admit to Hospitalist service with neurology following    Heide Scaleshristopher J Alim Cattell, MD 04/01/16 1043

## 2016-03-31 NOTE — Consult Note (Signed)
Neurology Consult Note  Reason for Consultation: Expressive aphasia  Requesting provider: Lynden Oxford, MD  CC: "I couldn't get my words out."   HPI: This is a 76 year old right-handed man who presents to the emergency department for evaluation of some speech changes. History is obtained directly from the patient was a fair historian. His wife is present at the bedside and offers additional information is needed.  It sounds as if the patient was in his usual state of health until he woke up this morning. His wife reports that he got up at about 6:00 this morning to go to the bathroom and complained of some numbness in his right hand. They also noticed that he is having difficulty with his language. Specifically, he felt like he was in difficulty getting his words out and his wife reports that his language was nonsensical at times. This appears to have resolved and he is presently not endorsing any of these symptoms. It is unclear to me how long symptoms lasted as he is not able to tell me when exactly they resolved. He has never had any similar previous symptoms. He denies any vision changes, difficulty swallowing, slurred speech, new weakness, problems with coordination, or problems with balance.  Last known well: 2300 03/30/16 NIHSS score: 0 tPA given?: No, symptoms resolved, outside of window   PMH:  Past Medical History:  Diagnosis Date  . Interstitial cystitis   . Scarlet fever 11/09/2007   Annotation: as a child, no sequellae Qualifier: History of  By: Debby Bud MD, Rosalyn Gess WHOOPING COUGH 11/10/2007   Qualifier: History of  By: Debby Bud MD, Rosalyn Gess     PSH:  Past Surgical History:  Procedure Laterality Date  . CYSTOSTOMY W/ BLADDER BIOPSY     2  . INGUINAL HERNIA REPAIR Bilateral   . KNEE ARTHROSCOPY Left   . TONSILLECTOMY  1948    Family history: Family History  Problem Relation Age of Onset  . Colon cancer Mother   . Alcohol abuse Father     Social  history:  Social History   Social History  . Marital status: Married    Spouse name: N/A  . Number of children: N/A  . Years of education: N/A   Occupational History  . Not on file.   Social History Main Topics  . Smoking status: Never Smoker  . Smokeless tobacco: Never Used  . Alcohol use Not on file  . Drug use: No  . Sexual activity: Not on file   Other Topics Concern  . Not on file   Social History Narrative  . No narrative on file    Current outpatient meds: Medications reviewed and reconciled Current Meds  Medication Sig  . docusate sodium (COLACE) 100 MG capsule Take 100 mg by mouth every evening.  . hydrOXYzine (ATARAX/VISTARIL) 25 MG tablet Take 12.5 mg by mouth every other day.  . Multiple Vitamins-Minerals (ADULT ONE DAILY GUMMIES) CHEW Chew 2 each by mouth every evening.   . pentosan polysulfate (ELMIRON) 100 MG capsule Take 2 capsules (200 mg total) by mouth daily. (Patient taking differently: Take 100 mg by mouth every evening. )  . solifenacin (VESICARE) 10 MG tablet Take 10 mg by mouth every evening.  . tamsulosin (FLOMAX) 0.4 MG CAPS Take 1 capsule (0.4 mg total) by mouth daily. (Patient taking differently: Take 0.4 mg by mouth daily after supper. )    Current inpatient meds: Medications reviewed and reconciled Current Facility-Administered Medications  Medication Dose Route  Frequency Provider Last Rate Last Dose  . lidocaine (XYLOCAINE) 2 % jelly 1 application  1 application Topical Once Heide Scaleshristopher J Tegeler, MD       Current Outpatient Prescriptions  Medication Sig Dispense Refill  . docusate sodium (COLACE) 100 MG capsule Take 100 mg by mouth every evening.    . hydrOXYzine (ATARAX/VISTARIL) 25 MG tablet Take 12.5 mg by mouth every other day.    . Multiple Vitamins-Minerals (ADULT ONE DAILY GUMMIES) CHEW Chew 2 each by mouth every evening.     . pentosan polysulfate (ELMIRON) 100 MG capsule Take 2 capsules (200 mg total) by mouth daily. (Patient  taking differently: Take 100 mg by mouth every evening. )    . solifenacin (VESICARE) 10 MG tablet Take 10 mg by mouth every evening.    . tamsulosin (FLOMAX) 0.4 MG CAPS Take 1 capsule (0.4 mg total) by mouth daily. (Patient taking differently: Take 0.4 mg by mouth daily after supper. ) 30 capsule 3    Allergies: No Known Allergies  ROS: As per HPI. A full 14-point review of systems was performed and is otherwise notable for chronic pain with urination which is unchanged from baseline. Remainder of review of systems is unremarkable.  PE:  BP (!) 173/115   Pulse (!) 125   Temp 98.4 F (36.9 C)   Resp 17   Wt 80.3 kg (177 lb)   SpO2 91%   BMI 26.91 kg/m   General: WDWN, no acute distress. AAO x4. Speech clear, no dysarthria. While he is not most talkative, what he does say sounds fluent. He has rare paraphasic errors. Comprehension is intact, including to multi-step cross-body commands. Repetition is intact. Primary and secondary naming is normal. Affect is restricted and mildly irritable. Comportment is normal.  HEENT: Normocephalic. Neck supple without LAD. MMM, OP clear. Dentition good. Sclerae anicteric. No conjunctival injection.  CV: Regular, no murmur. Carotid pulses full and symmetric, no bruits. Distal pulses 2+ and symmetric.  Lungs: CTAB.  Abdomen: Soft, non-distended. Bowel sounds present x4.  Extremities: No C/C/E. Neuro:  CN: Pupils are equal and round. They are symmetrically reactive from 3-->2 mm. EOMI without nystagmus. He has some breakup of smooth pursuits in all directions of gaze. No reported diplopia. Facial sensation is intact to light touch. Face is symmetric at rest with normal strength and mobility. Hearing is intact to conversational voice. Palate elevates symmetrically and uvula is midline. Voice is normal in tone, pitch and quality. Bilateral SCM and trapezii are 5/5. Tongue is midline with normal bulk and mobility.  Motor: Normal bulk, tone, and strength. No  tremor or other abnormal movements. No drift.  Sensation: Intact to light touch and pinprick.  DTRs: 2+, symmetric. Toes downgoing bilaterally. No pathologic reflexes.  Coordination: Finger-to-nose and heel-to-shin are without dysmetria. Finger taps are normal in amplitude and speed, no decrement.    Labs:  Lab Results  Component Value Date   WBC 9.5 03/31/2016   HGB 18.4 (H) 03/31/2016   HCT 54.0 (H) 03/31/2016   PLT 186 03/31/2016   GLUCOSE 101 (H) 03/31/2016   CHOL 170 02/09/2015   TRIG 82.0 02/09/2015   HDL 38.10 (L) 02/09/2015   LDLCALC 115 (H) 02/09/2015   ALT 16 (L) 03/31/2016   AST 21 03/31/2016   NA 141 03/31/2016   K 3.9 03/31/2016   CL 101 03/31/2016   CREATININE 0.90 03/31/2016   BUN 11 03/31/2016   CO2 27 03/31/2016   TSH 4.44 02/09/2015   INR  1.05 03/31/2016   HGBA1C 5.6 02/09/2015   Serum ethanol less than 5 PTT 37 Urine drug screen negative Urinalysis negative Troponin 0.00  Imaging:  I have personally and independently reviewed the CT scan of the head without contrast from today. This is notable for severe chronic small vessel ischemic changes in the bihemispheric white matter. Mild diffuse atrophy is noted. No acute pathology is appreciated. Atherosclerotic calcification of the carotid and vertebral arteries is noted.  Assessment and Plan:  1. Possible acute ischemic stroke: History is reported is concerning for possible TIA versus stroke involving the left MCA territory or less likely the left thalamus. His known risk factors for cerebrovascular disease include hyperlipidemia and age. Additional workup will be ordered to include MRI brain, MRA of the head, carotid Dopplers, TTE, fasting lipids, and hemoglobin a1c. Further testing will be determined by results from these initial studies. Recommend antiplatelet therapy with aspirin 81 mg daily for secondary stroke prevention. Start atorvastatin with goal LDL less than 70. Ensure adequate glucose control. Allow  permissive hypertension in the acute phase, treating only SBP greater than 220 mmHg and/or DBP greater than 110 mmHg. Avoid fever and hyperglycemia as these can extend the infarct. Avoid hypotonic IVF to minimize exacerbation of post-stroke edema. Initiate rehab services. DVT prophylaxis as needed.   2. Expressive aphasia: History is reported is consistent with a mild expressive aphasia that has since resolved. This is concerning for TIA versus stroke as noted above. No acute intervention necessary as this has resolved. Follow exam.  3. Right hand numbness: Again, concerning for possible TIA versus stroke. No acute intervention since this has completely resolved.  4. Cerebrovascular disease: He has severe chronic small vessel ischemic disease on CT scan of the head. Continue with risk factor modification with long-term goal systolic blood pressure less than 120, LDL less than 70, blood glucose normal. Recommend initiation of aspirin 81 mg daily and atorvastatin as above.  This was discussed with the patient and his wife. Education was provided on the diagnosis and expected evaluation and treatment. They are in agreement with the plan as noted. They were given the opportunity to ask any questions and these were addressed to their satisfaction.   This was briefly discussed with the ED attending, Dr. Rush Landmark, at the time of my visit.  Thank you for this consultation. The stroke team will assume care of the patient beginning 04/01/16. Please feel free to call with any urgent questions or concerns.

## 2016-03-31 NOTE — ED Notes (Signed)
Pt laying comfortably in bed, pt relaxed.

## 2016-04-01 ENCOUNTER — Encounter (HOSPITAL_COMMUNITY): Payer: Self-pay | Admitting: Nurse Practitioner

## 2016-04-01 LAB — LIPID PANEL
CHOL/HDL RATIO: 4.9 ratio
CHOLESTEROL: 175 mg/dL (ref 0–200)
HDL: 36 mg/dL — AB (ref >40)
LDL CALC: 127 mg/dL — AB (ref 0–99)
TRIGLYCERIDES: 61 mg/dL (ref <150)
VLDL: 12 mg/dL (ref 0–40)

## 2016-04-01 MED ORDER — ASPIRIN EC 325 MG PO TBEC
325.0000 mg | DELAYED_RELEASE_TABLET | Freq: Every day | ORAL | Status: DC
  Administered 2016-04-02: 325 mg via ORAL
  Filled 2016-04-01: qty 1

## 2016-04-01 NOTE — ED Notes (Signed)
MD notified for diet order. Pt has passed swallow screen.

## 2016-04-01 NOTE — Progress Notes (Signed)
PROGRESS NOTE  Cody [name]  WUJ:[id number] DOB: 1940-10-18 DOA: 03/31/2016 PCP: Pincus Sanes, MD   Brief Narrative: Cody [name] is a 76 y.o. male with medical history significant of interstitial cystitis, scarlet fever, whooping cough, who presents with difficulty speaking and right hand numbness.  Per pt's wife, he was last known normal at about 11:00 PM last night. His wife reports that he got up at about 6:00 on the morning of admission complaining of right hand numbness/heaviness and his speech was garbled and nonsensical at times. He was brought to the ED where speech symptoms had resolved. Pt was found to have negative UDS, WBC 9.5, negative urinalysis, INR 1.05, electrolytes renal function okay, blood pressure elevated to 209/104-->165/111. CXR showed stable right hemidiaphragm elevation. CT head was negative. MRI of brain and MRA of head showed two discrete punctate (2 millimeter) foci of acute ischemia within the left frontoparietal white matter, with the slightly larger lesion at the base central sulcus. No intracranial arterial occlusion or high-grade stenosis. Neurology, Dr. Roxy Manns was consulted. Overnight all neurological deficits have resolved.   Assessment & Plan: Principal Problem:   Stroke (cerebrum) (HCC) Active Problems:   Hyperlipidemia   Interstitial cystitis  Stroke: MRI of brain and MRA of head showed two discrete punctate (2 millimeter) foci of acute ischemia within the left frontoparietal white matter, with the slightly larger lesion at the base central sulcus. No intracranial arterial occlusion or high-grade stenosis.  - ASA 81mg  per neurology - Carotid dopplers and echo pendings - Allowing permissive HTN (<220/110). Avoid hypotonic IVF and hyperglycemia. - Started high-intensity statin. LDL 127. Recent HbA1c was 5.6%.  - PT/OT consult  HLD: LDL 127 on FLP 3/20. Goal will be <70 - Started lipitor 80 mg daily   Hx of Interstitial cystitis: stable -  Continue pentosan, viscare and flomax  DVT prophylaxis: Lovenox Code Status: Full Family Communication: Wife and various family members at bedside this AM. Disposition Plan: Therapy evaluations pending, continue CVA work up. Anticipate DC to home 3/21.   Consultants:   Neurology  Procedures:   Echo, carotid dopplers  Antimicrobials:  None   Subjective: Deficits resolved. In high spirits. No chest pain, palpitations, dyspnea, abnormal speech.   Objective: Vitals:   04/01/16 0900 04/01/16 0930 04/01/16 0956 04/01/16 1015  BP: (!) 139/97 (!) 168/98 (!) 182/99 (!) 152/83  Pulse: 75 97 84 94  Resp: (!) 24 (!) 24 18 (!) 30  Temp:   99 F (37.2 C)   TempSrc:   Oral   SpO2: 95% 94% 92% 92%  Weight:       No intake or output data in the 24 hours ending 04/01/16 1255 Filed Weights   03/31/16 1502  Weight: 80.3 kg (177 lb)    Examination: General exam: 76 y.o. male in no distress Respiratory system: Non-labored breathing on room air. Clear to auscultation bilaterally.  Cardiovascular system: Regular rate and rhythm. No murmur, rub, or gallop. No JVD, and no pedal edema. Gastrointestinal system: Abdomen soft, non-tender, non-distended, with normoactive bowel sounds. No organomegaly or masses felt. Central nervous system: Alert and oriented. Speech and gait normal. No focal neurological deficits. Extremities: Warm, no deformities Skin: No rashes, lesions no ulcers Psychiatry: Judgement and insight appear normal. Mood & affect appropriate.   Data Reviewed: I have personally reviewed following labs and imaging studies  CBC:  Recent Labs Lab 03/31/16 1521 03/31/16 1545  WBC 9.5  --   NEUTROABS 6.6  --   HGB  17.5* 18.4*  HCT 51.1 54.0*  MCV 87.1  --   PLT 186  --    Basic Metabolic Panel:  Recent Labs Lab 03/31/16 1521 03/31/16 1545  NA 140 141  K 4.0 3.9  CL 102 101  CO2 27  --   GLUCOSE 102* 101*  BUN 11 11  CREATININE 1.02 0.90  CALCIUM 9.0  --     GFR: CrCl cannot be calculated (Unknown ideal weight.). Liver Function Tests:  Recent Labs Lab 03/31/16 1521  AST 21  ALT 16*  ALKPHOS 75  BILITOT 0.6  PROT 6.7  ALBUMIN 4.0   No results for input(s): LIPASE, AMYLASE in the last 168 hours. No results for input(s): AMMONIA in the last 168 hours. Coagulation Profile:  Recent Labs Lab 03/31/16 1521  INR 1.05   Cardiac Enzymes: No results for input(s): CKTOTAL, CKMB, CKMBINDEX, TROPONINI in the last 168 hours. BNP (last 3 results) No results for input(s): PROBNP in the last 8760 hours. HbA1C: No results for input(s): HGBA1C in the last 72 hours. CBG:  Recent Labs Lab 03/31/16 1523  GLUCAP 99   Lipid Profile:  Recent Labs  04/01/16 0221  CHOL 175  HDL 36*  LDLCALC 127*  TRIG 61  CHOLHDL 4.9   Thyroid Function Tests: No results for input(s): TSH, T4TOTAL, FREET4, T3FREE, THYROIDAB in the last 72 hours. Anemia Panel: No results for input(s): VITAMINB12, FOLATE, FERRITIN, TIBC, IRON, RETICCTPCT in the last 72 hours. Urine analysis:    Component Value Date/Time   COLORURINE YELLOW 03/31/2016 1527   APPEARANCEUR CLEAR 03/31/2016 1527   LABSPEC 1.013 03/31/2016 1527   PHURINE 8.0 03/31/2016 1527   GLUCOSEU NEGATIVE 03/31/2016 1527   GLUCOSEU NEGATIVE 10/06/2006 1617   HGBUR NEGATIVE 03/31/2016 1527   BILIRUBINUR NEGATIVE 03/31/2016 1527   KETONESUR NEGATIVE 03/31/2016 1527   PROTEINUR NEGATIVE 03/31/2016 1527   UROBILINOGEN 0.2 mg/dL 09/81/1914 7829   NITRITE NEGATIVE 03/31/2016 1527   LEUKOCYTESUR NEGATIVE 03/31/2016 1527   No results found for this or any previous visit (from the past 240 hour(s)).    Radiology Studies: Ct Head Wo Contrast  Result Date: 03/31/2016 CLINICAL DATA:  Expressive aphasia.  Tachycardia. EXAM: CT HEAD WITHOUT CONTRAST TECHNIQUE: Contiguous axial images were obtained from the base of the skull through the vertex without intravenous contrast. COMPARISON:  None. FINDINGS:  Brain: No evidence of acute infarction, hemorrhage, extra-axial collection, ventriculomegaly, or mass effect. Generalized cerebral atrophy. Periventricular white matter low attenuation likely secondary to microangiopathy. Vascular: Cerebrovascular atherosclerotic calcifications are noted. Skull: Negative for fracture or focal lesion. Sinuses/Orbits: Visualized portions of the orbits are unremarkable. Visualized portions of the paranasal sinuses and mastoid air cells are unremarkable. Other: None. IMPRESSION: No acute intracranial pathology. Electronically Signed   By: Elige Ko   On: 03/31/2016 16:17   Mr Brain Wo Contrast  Result Date: 03/31/2016 CLINICAL DATA:  Expressive aphasia and right hand numbness. EXAM: MRI HEAD WITHOUT CONTRAST MRA HEAD WITHOUT CONTRAST TECHNIQUE: Multiplanar, multiecho pulse sequences of the brain and surrounding structures were obtained without intravenous contrast. Angiographic images of the head were obtained using MRA technique without contrast. COMPARISON:  Head CT same day FINDINGS: MRI HEAD FINDINGS Brain: There is a punctate focus of diffusion restriction within the left frontoparietal white matter at the bases central sulcus. There is a smaller punctate focus more posteriorly within the left parietal white matter. There is diffuse confluent hyperintense T2-weighted signal within the periventricular, deep and subcortical white matter, most often seen in  the setting of chronic microvascular ischemia. No acute hemorrhage. No mass lesion or midline shift. No hydrocephalus or extra-axial fluid collection. The midline structures are normal. No age advanced or lobar predominant atrophy. Vascular: Major intracranial arterial and venous sinus flow voids are preserved. No evidence of chronic microhemorrhage or amyloid angiopathy. Skull and upper cervical spine: The visualized skull base, calvarium, upper cervical spine and extracranial soft tissues are normal. Sinuses/Orbits: No  fluid levels or advanced mucosal thickening. No mastoid effusion. Normal orbits. MRA HEAD FINDINGS Intracranial internal carotid arteries: Normal. Anterior cerebral arteries: Normal. Middle cerebral arteries: Normal. Posterior communicating arteries: Present bilaterally. 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 Basilar artery: Normal. Vertebral arteries: Left dominant. The right vertebral artery terminates in the posterior inferior cerebellar artery. Superior cerebellar arteries: Normal. Anterior inferior cerebellar arteries: Poorly visualized. Posterior inferior cerebellar arteries: Normal. IMPRESSION: 1. Two discrete punctate (2 millimeter) foci of acute ischemia within the left frontoparietal white matter, with the slightly larger lesion at the base central sulcus. This is in keeping with reported right upper extremity symptoms. 2. No hemorrhage or mass effect. 3. No intracranial arterial occlusion or high-grade stenosis. Electronically Signed   By: Deatra Robinson M.D.   On: 03/31/2016 22:23   Dg Chest Port 1 View  Result Date: 03/31/2016 CLINICAL DATA:  Tachycardia EXAM: PORTABLE CHEST 1 VIEW COMPARISON:  11/16/2007 FINDINGS: Elevation of the right hemidiaphragm is noted. The lungs are clear. No focal infiltrate or sizable effusion is seen. Cardiac shadow is within normal limits. No bony abnormality is seen. IMPRESSION: Elevation of the right hemidiaphragm which is stable from the prior exam. No other focal abnormality is noted. Electronically Signed   By: Alcide Clever M.D.   On: 03/31/2016 17:50   Mr Maxine Glenn Headm  Result Date: 03/31/2016 CLINICAL DATA:  Expressive aphasia and right hand numbness. EXAM: MRI HEAD WITHOUT CONTRAST MRA HEAD WITHOUT CONTRAST TECHNIQUE: Multiplanar, multiecho pulse sequences of the brain and surrounding structures were obtained without intravenous  contrast. Angiographic images of the head were obtained using MRA technique without contrast. COMPARISON:  Head CT same day FINDINGS: MRI HEAD FINDINGS Brain: There is a punctate focus of diffusion restriction within the left frontoparietal white matter at the bases central sulcus. There is a smaller punctate focus more posteriorly within the left parietal white matter. There is diffuse confluent hyperintense T2-weighted signal within the periventricular, deep and subcortical white matter, most often seen in the setting of chronic microvascular ischemia. No acute hemorrhage. No mass lesion or midline shift. No hydrocephalus or extra-axial fluid collection. The midline structures are normal. No age advanced or lobar predominant atrophy. Vascular: Major intracranial arterial and venous sinus flow voids are preserved. No evidence of chronic microhemorrhage or amyloid angiopathy. Skull and upper cervical spine: The visualized skull base, calvarium, upper cervical spine and extracranial soft tissues are normal. Sinuses/Orbits: No fluid levels or advanced mucosal thickening. No mastoid effusion. Normal orbits. MRA HEAD FINDINGS Intracranial internal carotid arteries: Normal. Anterior cerebral arteries: Normal. Middle cerebral arteries: Normal. Posterior communicating arteries: Present bilaterally. 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 Basilar artery: Normal. Vertebral arteries: Left dominant. The right vertebral artery terminates in the posterior inferior cerebellar artery. Superior cerebellar arteries: Normal. Anterior inferior cerebellar arteries: Poorly visualized. Posterior inferior cerebellar  arteries: Normal. IMPRESSION: 1. Two discrete punctate (2 millimeter) foci of acute ischemia within the left frontoparietal white matter, with the slightly larger lesion at the base central sulcus.  This is in keeping with reported right upper extremity symptoms. 2. No hemorrhage or mass effect. 3. No intracranial arterial occlusion or high-grade stenosis. Electronically Signed   By: Deatra RobinsonKevin  Herman M.D.   On: 03/31/2016 22:23    Scheduled Meds: .  stroke: mapping our early stages of recovery book   Does not apply Once  . aspirin  81 mg Oral Daily  . atorvastatin  80 mg Oral q1800  . darifenacin  7.5 mg Oral Daily  . docusate sodium  100 mg Oral QPM  . enoxaparin (LOVENOX) injection  40 mg Subcutaneous Daily  . hydrOXYzine  12.5 mg Oral QODAY  . lidocaine  1 application Topical Once  . multivitamin with minerals  2 tablet Oral QPM  . pentosan polysulfate  100 mg Oral QPM  . tamsulosin  0.4 mg Oral QPC supper   Continuous Infusions:   LOS: 1 day   Time spent: 25 minutes.  Hazeline Junkeryan Grunz, MD [facility] Pager [phone]  If 7PM-7AM, please contact night-coverage [email or web address] Password [email or web address] 04/01/2016, 12:55 PM

## 2016-04-01 NOTE — Progress Notes (Signed)
STROKE TEAM PROGRESS NOTE   SUBJECTIVE (INTERVAL HISTORY) His wife is at the bedside.  He remains in the hall in the ED awaiting a bed. Denies hx of stroke. Tearful given new stroke diagnosis.   OBJECTIVE Temp:  [98.4 F (36.9 C)-99 F (37.2 C)] 99 F (37.2 C) (03/20 0956) Pulse Rate:  [67-157] 85 (03/20 1504) Resp:  [17-31] 18 (03/20 1504) BP: (139-209)/(83-139) 152/94 (03/20 1504) SpO2:  [89 %-97 %] 96 % (03/20 1504)  CBC:   Recent Labs Lab 03/31/16 1521 03/31/16 1545  WBC 9.5  --   NEUTROABS 6.6  --   HGB 17.5* 18.4*  HCT 51.1 54.0*  MCV 87.1  --   PLT 186  --     Basic Metabolic Panel:   Recent Labs Lab 03/31/16 1521 03/31/16 1545  NA 140 141  K 4.0 3.9  CL 102 101  CO2 27  --   GLUCOSE 102* 101*  BUN 11 11  CREATININE 1.02 0.90  CALCIUM 9.0  --    HgbA1c:  Lab Results  Component Value Date   HGBA1C 5.6 02/09/2015    PHYSICAL EXAM Pleasant elderly gentleman not in distress who is   emotional labile. . Afebrile. Head is nontraumatic. Neck is supple without bruit.    Cardiac exam no murmur or gallop. Lungs are clear to auscultation. Distal pulses are well felt. Neurological Exam ;  Awake  Alert oriented x 3. Normal speech and language.eye movements full without nystagmus.fundi were not visualized. Vision acuity and fields appear normal. Hearing is normal. Palatal movements are normal. Face symmetric. Tongue midline. Normal strength, tone, reflexes and coordination. Normal sensation. Gait deferred.  ASSESSMENT/PLAN Mr. ELEK HOLDERNESS is a 76 y.o. male with history of interstitial cystitis, scarlet fever, whooping cough presenting with difficulty speaker and R hand numbness. He did not receive IV t-PA due to improved symptoms, outside the window.   Stroke:  2 frontal parietal white matter infarcts secondary to small vessel disease  CT head no acute finding   MRI  2 L frontal parietal white matter infarcts   MRA  No occlusion or high grade  stenosis  Carotid Doppler  pending   2D Echo  pending   LDL 127  HgbA1c pending  Lovenox 40 mg sq daily for VTE prophylaxis Diet Heart Room service appropriate? Yes; Fluid consistency: Thin  No antithrombotic prior to admission, now on aspirin 81 mg daily. Neurology prefers 325 mg daily. Will change.  Therapy recommendations:  HH PT (uses a walker at home)  Disposition:  pending   Hypertension  Stable  Permissive hypertension (OK if < 220/120) but gradually normalize in 5-7 days  Long-term BP goal normotensive  Hyperlipidemia  Home meds:  No statin  LDL 127 goal  now on lipitor 80 mg  Continue statin at discharge  Other Stroke Risk Factors  Advanced age  UDS negative  Other Active Problems  Interstitial cystitis  Hospital day # 1  [facility] See Amion for Pager information 04/01/2016 3:09 PM  I have personally examined this patient, reviewed notes, independently viewed imaging studies, participated in medical decision making and plan of care.ROS completed by me personally and pertinent positives fully documented  I have made any additions or clarifications directly to the above note. Agree with note above. He presented with transient speech difficulties and  Hand numbness do to small frontoparietal infarcts from small vessel disease. Recommend aspirin for stroke prevention and statin for elevated lipids. Continue  ongoing stroke workup and aggressive risk factor modification. Greater than 50% time during this 35 minute visit was spent on counseling and coordination of care about his lacunar strokes, discussion about evaluation and management and answering questions.  Delia HeadyPramod Sethi, MD Medical Director [facility] Pager: [phone] 04/01/2016 10:49 PM  To contact Stroke Continuity provider, please refer to [email or web address]. After hours, contact General Neurology

## 2016-04-01 NOTE — Progress Notes (Signed)
PT Cancellation Note  Patient Details Name: Cody CoyerJohn E [name] MRN: [id number] DOB: [birth year]   Cancelled Treatment:    Pt is currently on bedrest. Discussed needing activity orders with nurse. Will follow for evaluation as appropriate.    Christiane HaBenjamin J. Crystall Donaldson, PT, CSCS Pager [phone] Office [phone]  04/01/2016, 8:42 AM

## 2016-04-01 NOTE — Evaluation (Signed)
Physical Therapy Evaluation Patient Details Name: Cody CoyerJohn E [name] MRN: [id number] DOB: May 12, 1940 Today's Date: 04/01/2016   History of Present Illness  76 y.o.maleinitially presenting with difficulty speaking and right hand numbness.MRI of brain and MRA of headshowed two discrete punctate (2 millimeter) foci of acute ischemia within the left frontoparietal white matter, with the slightly larger lesion at the base central sulcus. PMH: interstitial cystitis, scarlet fever, whooping cough,  Lt knee surgery.   Clinical Impression  Pt seen for initial evaluation and treatment. Pt able to ambulate 17 ft with rw and min guard assistance. Pt does have mild instability but no gross loss of balance and distance limited by fatigue. Spouse present throughout session and confirms that she will be around to assist pt when he returns home. Pt remains appropriate for continued PT session with anticipation of D/C to home with family support. PT to continue to follow to progress mobility and safety.     Follow Up Recommendations Home health PT;Supervision for mobility/OOB    Equipment Recommendations  None recommended by PT    Recommendations for Other Services       Precautions / Restrictions Precautions Precautions: Fall Restrictions Weight Bearing Restrictions: No      Mobility  Bed Mobility Overal bed mobility: Needs Assistance Bed Mobility: Supine to Sit;Sit to Supine     Supine to sit: Mod assist Sit to supine: Mod assist   General bed mobility comments: assist provided at trunk to come to sitting and with LEs to return to bed.   Transfers Overall transfer level: Needs assistance Equipment used: Rolling walker (2 wheeled) Transfers: Sit to/from Stand Sit to Stand: Min assist         General transfer comment: Cues for hand placement  Ambulation/Gait Ambulation/Gait assistance: Min guard Ambulation Distance (Feet): 17 Feet Assistive device: Rolling walker (2 wheeled) Gait  Pattern/deviations: Step-through pattern;Decreased stride length Gait velocity: decreased   General Gait Details: Pt with slow pattern, flexed trunk. Mild instability but no loss of balance.   Stairs            Wheelchair Mobility    Modified Rankin (Stroke Patients Only) Modified Rankin (Stroke Patients Only) Pre-Morbid Rankin Score: Moderate disability Modified Rankin: Moderately severe disability     Balance Overall balance assessment: Needs assistance Sitting-balance support: No upper extremity supported Sitting balance-Leahy Scale: Fair     Standing balance support: No upper extremity supported Standing balance-Leahy Scale: Fair Standing balance comment: able to stand to use urinal with min guard                             Pertinent Vitals/Pain Pain Assessment: No/denies pain    Home Living Family/patient expects to be discharged to:: Private residence Living Arrangements: Spouse/significant other Available Help at Discharge: Family;Available 24 hours/day Type of Home: Apartment Home Access: Level entry     Home Layout: One level Home Equipment: Walker - 4 wheels;Walker - 2 wheels;Wheelchair - manual      Prior Function Level of Independence: Needs assistance   Gait / Transfers Assistance Needed: reports using rollator walker primarily, will walk around house holding furniture at times.   ADL's / Homemaking Assistance Needed: Spouse assist with bathing, pt does own dressing except on Sundays for church.         Hand Dominance        Extremity/Trunk Assessment   Upper Extremity Assessment Upper Extremity Assessment: Overall WFL for tasks assessed  Lower Extremity Assessment Lower Extremity Assessment: RLE deficits/detail RLE Deficits / Details: decreased Rt ankle dorsiflexion (4+/5 Rt, 5/5 Lt).  RLE Sensation:  (intact to light touch) RLE Coordination:  (decreased alternating movement coordination. )    Cervical / Trunk  Assessment Cervical / Trunk Assessment: Kyphotic  Communication   Communication: Expressive difficulties (pt describes having some difficulty forming his words)  Cognition Arousal/Alertness: Awake/alert Behavior During Therapy: WFL for tasks assessed/performed Overall Cognitive Status: Within Functional Limits for tasks assessed                      General Comments General comments (skin integrity, edema, etc.): ambulation performed on RA, SpO2 in 90s throughout. BP within permissive hypertension range.     Exercises     Assessment/Plan    PT Assessment Patient needs continued PT services  PT Problem List Decreased strength;Decreased activity tolerance;Decreased balance;Decreased mobility;Decreased coordination       PT Treatment Interventions DME instruction;Gait training;Functional mobility training;Therapeutic activities;Therapeutic exercise;Balance training;Neuromuscular re-education;Patient/family education    PT Goals (Current goals can be found in the Care Plan section)  Acute Rehab PT Goals Patient Stated Goal: get back home PT Goal Formulation: With patient/family Time For Goal Achievement: 04/15/16 Potential to Achieve Goals: Good    Frequency Min 4X/week   Barriers to discharge        Co-evaluation               End of Session Equipment Utilized During Treatment: Gait belt Activity Tolerance: Patient tolerated treatment well;Patient limited by fatigue Patient left: in bed;with call bell/phone within reach;with family/visitor present Nurse Communication: Mobility status PT Visit Diagnosis: Unsteadiness on feet (R26.81);History of falling (Z91.81)         Time: 1308-6578 PT Time Calculation (min) (ACUTE ONLY): 42 min   Charges:   PT Evaluation $PT Eval Moderate Complexity: 1 Procedure PT Treatments $Gait Training: 8-22 mins $Therapeutic Activity: 8-22 mins   PT G Codes:         Christiane Ha, PT, CSCS Pager [phone] Office [phone]  04/01/2016, 12:59 PM

## 2016-04-01 NOTE — ED Notes (Signed)
Phlebotomy at bedside.

## 2016-04-02 ENCOUNTER — Inpatient Hospital Stay (HOSPITAL_COMMUNITY): Payer: Medicare Other

## 2016-04-02 DIAGNOSIS — R310 Gross hematuria: Secondary | ICD-10-CM

## 2016-04-02 DIAGNOSIS — I6789 Other cerebrovascular disease: Secondary | ICD-10-CM

## 2016-04-02 DIAGNOSIS — I6300 Cerebral infarction due to thrombosis of unspecified precerebral artery: Secondary | ICD-10-CM

## 2016-04-02 DIAGNOSIS — I639 Cerebral infarction, unspecified: Secondary | ICD-10-CM

## 2016-04-02 LAB — VAS US CAROTID
LCCADDIAS: 10 cm/s
LCCAPDIAS: 11 cm/s
LEFT ECA DIAS: -11 cm/s
LEFT VERTEBRAL DIAS: 8 cm/s
LICADSYS: -154 cm/s
Left CCA dist sys: 63 cm/s
Left CCA prox sys: 83 cm/s
Left ICA dist dias: -22 cm/s
Left ICA prox dias: 46 cm/s
Left ICA prox sys: 183 cm/s
RCCADSYS: 74 cm/s
RCCAPDIAS: 11 cm/s
RIGHT ECA DIAS: 17 cm/s
RIGHT VERTEBRAL DIAS: 5 cm/s
Right CCA prox sys: 71 cm/s

## 2016-04-02 LAB — URINALYSIS, COMPLETE (UACMP) WITH MICROSCOPIC
BILIRUBIN URINE: NEGATIVE
Glucose, UA: NEGATIVE mg/dL
Ketones, ur: 5 mg/dL — AB
Leukocytes, UA: NEGATIVE
NITRITE: NEGATIVE
PH: 5 (ref 5.0–8.0)
Protein, ur: 100 mg/dL — AB
SPECIFIC GRAVITY, URINE: 1.017 (ref 1.005–1.030)
SQUAMOUS EPITHELIAL / LPF: NONE SEEN

## 2016-04-02 LAB — ECHOCARDIOGRAM COMPLETE: Weight: 2832 oz

## 2016-04-02 LAB — HEMOGLOBIN A1C
Hgb A1c MFr Bld: 5.5 % (ref 4.8–5.6)
MEAN PLASMA GLUCOSE: 111 mg/dL

## 2016-04-02 MED ORDER — ATORVASTATIN CALCIUM 80 MG PO TABS: 80.0000 mg | ORAL_TABLET | Freq: Every day | ORAL | 0 refills | Status: DC

## 2016-04-02 MED ORDER — ASPIRIN 325 MG PO TBEC: 325.0000 mg | DELAYED_RELEASE_TABLET | Freq: Every day | ORAL | 0 refills | Status: DC

## 2016-04-02 NOTE — Progress Notes (Signed)
PT Cancellation Note  Patient Details Name: Colbert CoyerJohn E Timberlake MRN: [id number] DOB: [birth year]   Cancelled Treatment:    Reason Eval/Treat Not Completed: Patient at procedure or test/unavailable (will f/u pending patient availability. )   Florestine AversAimee J Justan Gaede 04/02/2016, 10:31 AM Joycelyn RuaAimee Konnar Ben, PTA pager [phone]

## 2016-04-02 NOTE — Discharge Summary (Signed)
Physician Discharge Summary  Cody [name] BMW:[id number] DOB: [birth year] DOA: 03/31/2016  PCP: Pincus Sanes, MD  Admit date: 03/31/2016 Discharge date: 04/02/2016  Admitted From: Home Disposition: Home   Recommendations for Outpatient Follow-up:  1. Follow up with PCP in 1-2 weeks for ongoing risk factor modification. Started lipitor 80mg  and aspirin 325mg  per neuro recommendations.  2. Follow up with urology: Pt had hematuria while inpatient without anemia. After discussion with neurology and patient, will hold aspirin until urine clears.  3. Will follow up with Dr. Pearlean Brownie in 2 months. Patient has a 10-15% risk of having another stroke over the next year, the highest risk is within 2 weeks of the most recent stroke/TIA. 4. Please follow up on urine culture ordered on day of discharge.  Home Health: PT Equipment/Devices: Has walker at home Discharge Condition: Stable CODE STATUS: Full Diet recommendation: Heart healthy  Brief/Interim Summary: Cody Wolk Wheeleris a 76 y.o.malewith medical history significant of interstitial cystitis, scarlet fever, whooping cough, who presents with difficulty speaking and right hand numbness.  His wife reports that he got up at about 6:00 on the morning of admission complaining of right hand numbness/heaviness and his speech was garbled and nonsensical at times. He was brought to the ED where speech symptoms had resolved. Pt was found to have negative UDS, WBC 9.5, negative urinalysis, INR 1.05, electrolytes and renal function okay, blood pressure elevated to 209/104-->165/111.CXR showed stable right hemidiaphragm elevation. CT head was negative. MRI of brain and MRA of headshowed two discrete punctate (2 millimeter) foci of acute ischemia within the left frontoparietal white matter, with the slightly larger lesion at the base central sulcus. No intracranial arterial occlusion or high-grade stenosis. Neurology, Dr. Roxy Manns was consulted. Echocardiogram showed  no source of embolism, and carotid dopplers showed no high grade stenosis. Physical therapy worked with the patient, recommending ongoing home health PT which was ordered prior to discharge. All neurological deficits have resolved. High-intensity statin and aspirin 325mg  were started per neurology recommendations. He will follow up with Dr. Pearlean Brownie in 2 months, ordered per neurology note 3/21.   On day of discharge, patient developed red urine, confirmed to have +RBCs on microscopy in the setting of starting aspirin. He had no other symptoms. He is advised to follow up with his urologist, Dr. Mena Goes for further evaluation and to hold aspirin until hematuria subsides.   Discharge Diagnoses:  Principal Problem:   Stroke (cerebrum) (HCC) Active Problems:   Hyperlipidemia   Interstitial cystitis  Stroke:MRI of brain and MRA of head showed two discrete punctate (2 millimeter) foci of acute ischemia within the left frontoparietal white matter, with the slightly larger lesion at the base central sulcus. No intracranial arterial occlusion or high-grade stenosis.  - ASA 325mg  per neurology - Carotid dopplers showed R ICA 1-39%, L ICA 40-59% stenosis. No intervention planned per neurology. - Echocardiogram showed no valvular abnormalities or source of embolism. - Allowing permissive HTN (<220/110), though has remained normotensive without medications.  - Started high-intensity statin. LDL 127. Recent HbA1c was 5.6%.  - PT/OT consult: Recommended home health PT, ordered.   HLD:LDL 127 on FLP 3/20. Goal will be <70 - Started lipitor 80 mg daily  Hx of Interstitial cystitis: stable - Continue pentosan (turned urine red, previous UA completely negative), flomax  Discharge Instructions Discharge Instructions    Ambulatory referral to Neurology    Complete by:  As directed    Stroke patient. Dr. Pearlean Brownie prefers follow up in 6 weeks  Discharge instructions    Complete by:  As directed    You were  admitted for a stroke and your symptoms seem to have resolved. The treatment includes lowering your risk for future strokes and rehabilitation. You are stable for discharge with the following recommendations:  - START taking aspirin 325mg  daily for secondary stroke prevention.  - START taking lipitor 80mg  daily for secondary stroke prevention.  - Continue taking other medications as you were.  - You will have home health physical therapy follow you after discharge. Maintain an active lifestyle and healthy diet.  - You will be contacted to follow up with Dr. Pearlean BrownieSethi (neurology), and you should also follow up with your PCP in the next 1 - 2 weeks.  - If you notice any new weakness, numbness, trouble speaking or walking, you should return for care right away.     Allergies as of 04/02/2016   No Known Allergies     Medication List    TAKE these medications   ADULT ONE DAILY GUMMIES Chew Chew 2 each by mouth every evening.   aspirin 325 MG EC tablet Take 1 tablet (325 mg total) by mouth daily. Start taking on:  04/03/2016   atorvastatin 80 MG tablet Commonly known as:  LIPITOR Take 1 tablet (80 mg total) by mouth daily at 6 PM.   docusate sodium 100 MG capsule Commonly known as:  COLACE Take 100 mg by mouth every evening.   hydrOXYzine 25 MG tablet Commonly known as:  ATARAX/VISTARIL Take 12.5 mg by mouth every other day.   pentosan polysulfate 100 MG capsule Commonly known as:  ELMIRON Take 2 capsules (200 mg total) by mouth daily. What changed:  how much to take  when to take this   solifenacin 10 MG tablet Commonly known as:  VESICARE Take 10 mg by mouth every evening.   tamsulosin 0.4 MG Caps capsule Commonly known as:  FLOMAX Take 1 capsule (0.4 mg total) by mouth daily. What changed:  when to take this      Follow-up Information    SETHI,PRAMOD, MD Follow up in 6 week(s).   Specialties:  Neurology, Radiology Why:  stroke clinic. office will call with appt date  and time. Contact information: [address] [place] [address] [phone]        Pincus SanesStacy J Burns, MD. Schedule an appointment as soon as possible for a visit in 1 week(s).   Specialty:  Internal Medicine Contact information: [address] [place] [address] [phone]          No Known Allergies  Consultations:  Stroke team, Dr. Pearlean BrownieSethi  Procedures/Studies: Ct Head Wo Contrast  Result Date: 03/31/2016 CLINICAL DATA:  Expressive aphasia.  Tachycardia. EXAM: CT HEAD WITHOUT CONTRAST TECHNIQUE: Contiguous axial images were obtained from the base of the skull through the vertex without intravenous contrast. COMPARISON:  None. FINDINGS: Brain: No evidence of acute infarction, hemorrhage, extra-axial collection, ventriculomegaly, or mass effect. Generalized cerebral atrophy. Periventricular white matter low attenuation likely secondary to microangiopathy. Vascular: Cerebrovascular atherosclerotic calcifications are noted. Skull: Negative for fracture or focal lesion. Sinuses/Orbits: Visualized portions of the orbits are unremarkable. Visualized portions of the paranasal sinuses and mastoid air cells are unremarkable. Other: None. IMPRESSION: No acute intracranial pathology. Electronically Signed   By: Elige KoHetal  Patel   On: 03/31/2016 16:17   Mr Brain Wo Contrast  Result Date: 03/31/2016 CLINICAL DATA:  Expressive aphasia and right hand numbness. EXAM: MRI HEAD WITHOUT CONTRAST MRA  HEAD WITHOUT CONTRAST TECHNIQUE: Multiplanar, multiecho pulse sequences of the brain and surrounding structures were obtained without intravenous contrast. Angiographic images of the head were obtained using MRA technique without contrast. COMPARISON:  Head CT same day FINDINGS: MRI HEAD FINDINGS Brain: There is a punctate focus of diffusion restriction within the left frontoparietal white matter at the bases central sulcus. There is a smaller punctate focus more posteriorly within the left  parietal white matter. There is diffuse confluent hyperintense T2-weighted signal within the periventricular, deep and subcortical white matter, most often seen in the setting of chronic microvascular ischemia. No acute hemorrhage. No mass lesion or midline shift. No hydrocephalus or extra-axial fluid collection. The midline structures are normal. No age advanced or lobar predominant atrophy. Vascular: Major intracranial arterial and venous sinus flow voids are preserved. No evidence of chronic microhemorrhage or amyloid angiopathy. Skull and upper cervical spine: The visualized skull base, calvarium, upper cervical spine and extracranial soft tissues are normal. Sinuses/Orbits: No fluid levels or advanced mucosal thickening. No mastoid effusion. Normal orbits. MRA HEAD FINDINGS Intracranial internal carotid arteries: Normal. Anterior cerebral arteries: Normal. Middle cerebral arteries: Normal. Posterior communicating arteries: Present bilaterally. 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 Basilar artery: Normal. Vertebral arteries: Left dominant. The right vertebral artery terminates in the posterior inferior cerebellar artery. Superior cerebellar arteries: Normal. Anterior inferior cerebellar arteries: Poorly visualized. Posterior inferior cerebellar arteries: Normal. IMPRESSION: 1. Two discrete punctate (2 millimeter) foci of acute ischemia within the left frontoparietal white matter, with the slightly larger lesion at the base central sulcus. This is in keeping with reported right upper extremity symptoms. 2. No hemorrhage or mass effect. 3. No intracranial arterial occlusion or high-grade stenosis. Electronically Signed   By: Deatra Robinson M.D.   On: 03/31/2016 22:23   Dg Chest Port 1 View  Result Date: 03/31/2016 CLINICAL DATA:  Tachycardia EXAM: PORTABLE CHEST 1 VIEW COMPARISON:   11/16/2007 FINDINGS: Elevation of the right hemidiaphragm is noted. The lungs are clear. No focal infiltrate or sizable effusion is seen. Cardiac shadow is within normal limits. No bony abnormality is seen. IMPRESSION: Elevation of the right hemidiaphragm which is stable from the prior exam. No other focal abnormality is noted. Electronically Signed   By: Alcide Clever M.D.   On: 03/31/2016 17:50   Mr Maxine Glenn Headm  Result Date: 03/31/2016 CLINICAL DATA:  Expressive aphasia and right hand numbness. EXAM: MRI HEAD WITHOUT CONTRAST MRA HEAD WITHOUT CONTRAST TECHNIQUE: Multiplanar, multiecho pulse sequences of the brain and surrounding structures were obtained without intravenous contrast. Angiographic images of the head were obtained using MRA technique without contrast. COMPARISON:  Head CT same day FINDINGS: MRI HEAD FINDINGS Brain: There is a punctate focus of diffusion restriction within the left frontoparietal white matter at the bases central sulcus. There is a smaller punctate focus more posteriorly within the left parietal white matter. There is diffuse confluent hyperintense T2-weighted signal within the periventricular, deep and subcortical white matter, most often seen in the setting of chronic microvascular ischemia. No acute hemorrhage. No mass lesion or midline shift. No hydrocephalus or extra-axial fluid collection. The midline structures are normal. No age advanced or lobar predominant atrophy. Vascular: Major intracranial arterial and venous sinus flow voids are preserved. No evidence of chronic microhemorrhage or amyloid angiopathy. Skull and upper cervical spine: The visualized skull base, calvarium, upper cervical spine and extracranial soft tissues  are normal. Sinuses/Orbits: No fluid levels or advanced mucosal thickening. No mastoid effusion. Normal orbits. MRA HEAD FINDINGS Intracranial internal carotid arteries: Normal. Anterior cerebral arteries: Normal. Middle cerebral arteries: Normal.  Posterior communicating arteries: Present bilaterally. 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 Basilar artery: Normal. Vertebral arteries: Left dominant. The right vertebral artery terminates in the posterior inferior cerebellar artery. Superior cerebellar arteries: Normal. Anterior inferior cerebellar arteries: Poorly visualized. Posterior inferior cerebellar arteries: Normal. IMPRESSION: 1. Two discrete punctate (2 millimeter) foci of acute ischemia within the left frontoparietal white matter, with the slightly larger lesion at the base central sulcus. This is in keeping with reported right upper extremity symptoms. 2. No hemorrhage or mass effect. 3. No intracranial arterial occlusion or high-grade stenosis. Electronically Signed   By: Deatra Robinson M.D.   On: 03/31/2016 22:23   Echocardiogram 3/21:  - Left ventricle: The cavity size was normal. Wall thickness was   increased in a pattern of mild LVH. Systolic function was   vigorous. The estimated ejection fraction was in the range of 65%   to 70%. Although no diagnostic regional wall motion abnormality   was identified, this possibility cannot be completely excluded on   the basis of this study. Doppler parameters are consistent with   abnormal left ventricular relaxation (grade 1 diastolic   dysfunction). LV mid cavity gradient, peak 30 mmHg. - Aortic valve: There was no stenosis. - Mitral valve: There was no significant regurgitation. - Right ventricle: The cavity size was normal. Systolic function   was normal. - Tricuspid valve: Peak RV-RA gradient 24 mmHg. - Systemic veins: IVC not visualized.  Impressions:  - Normal LV size with mild LV hypertrophy. EF 65-70%. There is an   LV mid-cavity gradient to peak 30 mmHg. Normal RV size and   systolic function. No significant valvular abnormalities.  Carotid U/S  3/21: Findings suggest 1-39% internal carotid artery stenosis. Vertebral arteries are patent with antegrade flow.  Subjective: Patient without neurological deficits. Said his words were jumbled upon waking in the night to urinate, though this resolved spontaneously quickly - pt attributes to being groggy. No chest pain, dyspnea, myalgias.   Discharge Exam: Vitals:   04/02/16 0502 04/02/16 0920  BP: 139/81 124/72  Pulse: 88 89  Resp: 20 19  Temp: 97.7 F (36.5 C) 98.2 F (36.8 C)   General: Pt is alert, awake, not in acute distress Cardiovascular: RRR, S1/S2 +, no rubs, no gallops Respiratory: CTA bilaterally, no wheezing, no rhonchi Neuro: Alert and oriented. Speech normal. Gait is slow with walker. Extremities: No edema, no cyanosis  The results of significant diagnostics from this hospitalization (including imaging, microbiology, ancillary and laboratory) are listed below for reference.    Labs: Basic Metabolic Panel:  Recent Labs Lab 03/31/16 1521 03/31/16 1545  NA 140 141  K 4.0 3.9  CL 102 101  CO2 27  --   GLUCOSE 102* 101*  BUN 11 11  CREATININE 1.02 0.90  CALCIUM 9.0  --    Liver Function Tests:  Recent Labs Lab 03/31/16 1521  AST 21  ALT 16*  ALKPHOS 75  BILITOT 0.6  PROT 6.7  ALBUMIN 4.0   CBC:  Recent Labs Lab 03/31/16 1521 03/31/16 1545  WBC 9.5  --   NEUTROABS 6.6  --   HGB 17.5* 18.4*  HCT 51.1 54.0*  MCV 87.1  --  PLT 186  --    CBG:  Recent Labs Lab 03/31/16 1523  GLUCAP 99   Hgb A1c  Recent Labs  04/01/16 0246  HGBA1C 5.5   Lipid Profile  Recent Labs  04/01/16 0221  CHOL 175  HDL 36*  LDLCALC 127*  TRIG 61  CHOLHDL 4.9   Urinalysis    Component Value Date/Time   COLORURINE AMBER (A) 04/02/2016 1440   APPEARANCEUR CLOUDY (A) 04/02/2016 1440   LABSPEC 1.017 04/02/2016 1440   PHURINE 5.0 04/02/2016 1440   GLUCOSEU NEGATIVE 04/02/2016 1440   GLUCOSEU NEGATIVE 10/06/2006 1617   HGBUR LARGE (A) 04/02/2016  1440   BILIRUBINUR NEGATIVE 04/02/2016 1440   KETONESUR 5 (A) 04/02/2016 1440   PROTEINUR 100 (A) 04/02/2016 1440   UROBILINOGEN 0.2 mg/dL 16/10/9602 5409   NITRITE NEGATIVE 04/02/2016 1440   LEUKOCYTESUR NEGATIVE 04/02/2016 1440   Time coordinating discharge: Approximately 40 minutes  Hazeline Junker, MD  [facility] 04/02/2016, 4:10 PM Pager [phone]  If 7PM-7AM, please contact night-coverage [email or web address] Password [email or web address]

## 2016-04-02 NOTE — Progress Notes (Signed)
*  PRELIMINARY RESULTS* Vascular Ultrasound Carotid Duplex (Doppler) has been completed.   Findings suggest 1-39% right internal carotid artery and 40-59% left internal carotid artery stenosis. Vertebral arteries are patent with antegrade flow.  04/02/2016 9:50 AM Gertie FeyMichelle Trayce Maino, BS, RVT, RDCS, RDMS

## 2016-04-02 NOTE — Progress Notes (Signed)
  Echocardiogram 2D Echocardiogram has been performed.  Cody SavoyCasey N Kaysin Brock 04/02/2016, 12:14 PM

## 2016-04-02 NOTE — Progress Notes (Signed)
Physical Therapy Treatment Patient Details Name: EBB CARELOCK MRN: [id number] DOB: Jun 19, 1940 Today's Date: 04/02/2016    History of Present Illness 76 y.o.maleinitially presenting with difficulty speaking and right hand numbness.MRI of brain and MRA of headshowed two discrete punctate (2 millimeter) foci of acute ischemia within the left frontoparietal white matter, with the slightly larger lesion at the base central sulcus. PMH: interstitial cystitis, scarlet fever, whooping cough,  Lt knee surgery.     PT Comments    Pt performed gait with PTA and received education on correct transfer techniques, safety with RW and height of RW to improve safety at d/c.  Wife present and observed session.  Plan remains appropriate to d/c home with support from his wife and HHPT services.  Spoke with RN after treatment to ensure HHPT was set-up.  Pt reports he has a RW at home.    Follow Up Recommendations  Home health PT;Supervision for mobility/OOB     Equipment Recommendations  None recommended by PT    Recommendations for Other Services       Precautions / Restrictions Precautions Precautions: Fall Restrictions Weight Bearing Restrictions: No    Mobility  Bed Mobility Overal bed mobility: Needs Assistance Bed Mobility: Supine to Sit     Supine to sit: Mod assist     General bed mobility comments: assist provided to elevate trunk into sitting.  Pt reports he has a rail at home that he uses to pull him self to the edge of the bed.    Transfers Overall transfer level: Needs assistance Equipment used: Rolling walker (2 wheeled) Transfers: Sit to/from Stand Sit to Stand: Supervision         General transfer comment: On 1st trial patient performed pulling on RW with wife holding the RW still.  Pt returned to seated position and PTA educated on correct technique to avoid pulling on RW and falling backwards.  Pt then able to successfully stand with supervision.     Ambulation/Gait Ambulation/Gait assistance: Supervision;Min guard Ambulation Distance (Feet): 60 Feet (+ 20 ft.  ) Assistive device: Rolling walker (2 wheeled) Gait Pattern/deviations: Step-through pattern;Decreased stride length;Shuffle;Drifts right/left;Trunk flexed Gait velocity: decreased Gait velocity interpretation: Below normal speed for age/gender General Gait Details: Pt remains with slow shuffled pattern.  Cues for upper trunk control but unable to gain full extension with cueing.  Educated patient and his wife on correct RW height when he returns home.  Pt requires cues to maintain position in RW.     Stairs Stairs:  (has flat entry into home)          Wheelchair Mobility    Modified Rankin (Stroke Patients Only) Modified Rankin (Stroke Patients Only) Pre-Morbid Rankin Score: Moderate disability Modified Rankin: Moderately severe disability     Balance Overall balance assessment: Needs assistance   Sitting balance-Leahy Scale: Fair       Standing balance-Leahy Scale: Poor                      Cognition Arousal/Alertness: Awake/alert Behavior During Therapy: WFL for tasks assessed/performed Overall Cognitive Status: Within Functional Limits for tasks assessed                      Exercises      General Comments        Pertinent Vitals/Pain Pain Assessment: No/denies pain    Home Living  Prior Function            PT Goals (current goals can now be found in the care plan section) Acute Rehab PT Goals Patient Stated Goal: get back home Potential to Achieve Goals: Good    Frequency    Min 4X/week      PT Plan Current plan remains appropriate    Co-evaluation             End of Session Equipment Utilized During Treatment: Gait belt Activity Tolerance: Patient tolerated treatment well;Patient limited by fatigue Patient left: in bed;with call bell/phone within reach;with  family/visitor present Nurse Communication: Mobility status PT Visit Diagnosis: Unsteadiness on feet (R26.81);History of falling (Z91.81)     Time: [id number] PT Time Calculation (min) (ACUTE ONLY): 17 min  Charges:  $Gait Training: 8-22 mins                    G Codes:       Florestine Aversimee J Dmiya Malphrus 04/02/2016, 3:10 PM Joycelyn RuaAimee Brendolyn Stockley, PTA pager [phone]

## 2016-04-02 NOTE — Progress Notes (Signed)
Pt is in bed. Speech is much improved. Alert and oriented x3.

## 2016-04-02 NOTE — Progress Notes (Signed)
Pt woke up trying to get out of bed, walked pt to bathroom, but didn't pee. Noticed he had some difficulty speaking. Notified [facility] and neurologist.

## 2016-04-02 NOTE — Progress Notes (Signed)
STROKE TEAM PROGRESS NOTE   SUBJECTIVE (INTERVAL HISTORY) His wife is at the bedside.  Hi is the father-in-law of Dr. Jens Som. Pt without new complaints.    OBJECTIVE Temp:  [97.7 F (36.5 C)-98.4 F (36.9 C)] 98.2 F (36.8 C) (03/21 0920) Pulse Rate:  [80-89] 89 (03/21 0920) Cardiac Rhythm: Normal sinus rhythm (03/21 0820) Resp:  [18-20] 19 (03/21 0920) BP: (124-159)/(72-94) 124/72 (03/21 0920) SpO2:  [91 %-98 %] 98 % (03/21 0920)  CBC:   Recent Labs Lab 03/31/16 1521 03/31/16 1545  WBC 9.5  --   NEUTROABS 6.6  --   HGB 17.5* 18.4*  HCT 51.1 54.0*  MCV 87.1  --   PLT 186  --     Basic Metabolic Panel:   Recent Labs Lab 03/31/16 1521 03/31/16 1545  NA 140 141  K 4.0 3.9  CL 102 101  CO2 27  --   GLUCOSE 102* 101*  BUN 11 11  CREATININE 1.02 0.90  CALCIUM 9.0  --     PHYSICAL EXAM Pleasant elderly gentleman not in distress who is   emotional labile. . Afebrile. Head is nontraumatic. Neck is supple without bruit.    Cardiac exam no murmur or gallop. Lungs are clear to auscultation. Distal pulses are well felt. Neurological Exam ;  Awake  Alert oriented x 3. Normal speech and language.eye movements full without nystagmus.fundi were not visualized. Vision acuity and fields appear normal. Hearing is normal. Palatal movements are normal. Face symmetric. Tongue midline. Normal strength, tone, reflexes and coordination. Normal sensation. Gait deferred.   ASSESSMENT/PLAN Mr. Cody [name] is a 76 y.o. male with history of interstitial cystitis, scarlet fever, whooping cough presenting with difficulty speaker and R hand numbness. He did not receive IV t-PA due to improved symptoms, outside the window.   Stroke:  2 frontal parietal white matter infarcts secondary to small vessel disease  CT head no acute finding   MRI  2 L frontal parietal white matter infarcts   MRA  No occlusion or high grade stenosis  Carotid Doppler  R ICA 1-39%, L ICA 40-59% stenosis  2D  Echo  pending   LDL 127  HgbA1c 5.5  Lovenox 40 mg sq daily for VTE prophylaxis Diet Heart Room service appropriate? Yes; Fluid consistency: Thin  No antithrombotic prior to admission 325 mg daily  Therapy recommendations:  HH PT (uses a walker at home)  Disposition:  Return home  Hypertension  Stable  Permissive hypertension (OK if < 220/120) but gradually normalize in 5-7 days  Long-term BP goal normotensive  Hyperlipidemia  Home meds:  No statin  LDL 127 goal  now on lipitor 80 mg  Continue statin at discharge  Other Stroke Risk Factors  Advanced age  UDS negative  Other Active Problems  Interstitial cystitis  Ok for discharge from stroke standpoint once 2D resulted if without significant findings.  Patient has a 10-15% risk of having another stroke over the next year, the highest risk is within 2 weeks of the most recent stroke/TIA (risk of having a stroke following a stroke or TIA is the same).  Ongoing risk factor control by Primary Care Physician  Stroke Service will sign off. Please call should any needs arise.  Follow-up Stroke Clinic at [facility] with Dr. Delia Heady in 2 months, order placed.  Hospital day # 2  Rhoderick Moody [facility] See Amion for Pager information 04/02/2016 1:00 PM  I have personally examined this patient,  reviewed notes, independently viewed imaging studies, participated in medical decision making and plan of care.ROS completed by me personally and pertinent positives fully documented  I have made any additions or clarifications directly to the above note. Agree with note above. Discuss with patient, wife and Dr. Jens Somrenshaw and answered questions.  Delia HeadyPramod Sethi, MD Medical Director [facility] Pager: [phone] 04/02/2016 4:20 PM  To contact Stroke Continuity provider, please refer to [email or web address]. After hours, contact General Neurology

## 2016-04-02 NOTE — Progress Notes (Signed)
Pt D/C home, no new concerns, D/C instructions provided with teach back, Pt is transported by [employer].

## 2016-04-02 NOTE — Evaluation (Signed)
Occupational Therapy Evaluation Patient Details Name: Cody CoyerJohn E Dresser MRN: [id number] DOB: 1940/04/04 Today's Date: 04/02/2016    History of Present Illness 76 y.o.maleinitially presenting with difficulty speaking and right hand numbness.MRI of brain and MRA of headshowed two discrete punctate (2 millimeter) foci of acute ischemia within the left frontoparietal white matter, with the slightly larger lesion at the base central sulcus. PMH: interstitial cystitis, scarlet fever, whooping cough,  Lt knee surgery.    Clinical Impression   PTA Pt mod A for ADL and Mod I for mobility with Rollator. Pt currently back at baseline according to Pt and wife and is mod A for ADL and min guard for mobility with RW. Please see performance level below. Ot had at length discussion with wife and Pt about HHOT (what OT wanted to recommend) and the wife stated that they've had it in the past and that her husband has no follow through "he only does the exercises when they are there" and are declining at this time even though OT explained that OT can be used as a way to improve functional independence and safety. At the end of the session, Pt with no further concerns or questions.     Follow Up Recommendations  Home health OT;Supervision/Assistance - 24 hour;No OT follow up (Please see general note info - Pt and wife declining HHOT)    Equipment Recommendations  None recommended by OT (Pt has appropriate DME)    Recommendations for Other Services       Precautions / Restrictions Precautions Precautions: Fall Restrictions Weight Bearing Restrictions: No      Mobility Bed Mobility Overal bed mobility: Needs Assistance Bed Mobility: Supine to Sit     Supine to sit: Mod assist     General bed mobility comments: assist provided to elevate trunk into sitting.  Pt reports he has a rail at home that he uses to pull him self to the edge of the bed.    Transfers Overall transfer level: Needs  assistance Equipment used: Rolling walker (2 wheeled) Transfers: Sit to/from Stand Sit to Stand: Min guard         General transfer comment: continued verbal cues for safe hand placement    Balance Overall balance assessment: Needs assistance Sitting-balance support: No upper extremity supported Sitting balance-Leahy Scale: Fair Sitting balance - Comments: sitting EOB for dressing   Standing balance support: Bilateral upper extremity supported;During functional activity;No upper extremity supported Standing balance-Leahy Scale: Poor                              ADL Overall ADL's : At baseline                                       General ADL Comments: Wife reports that he is hesitant to try anything new (prefers sponge bathing to using tub bench or tub shower chair) and that he is functioning at baseline. OT assisted to bathroom for urination, sink level grooming, and dressing (lower and upper body dressing).     Vision Baseline Vision/History: Wears glasses Wears Glasses: At all times Patient Visual Report: No change from baseline;Other (comment) (recent cataract surgery) Additional Comments: Pt able to read from menu and find objects in room, Pt reports that he had diplopia, but it has resolved.     Perception     Praxis  Pertinent Vitals/Pain Pain Assessment: No/denies pain     Hand Dominance     Extremity/Trunk Assessment Upper Extremity Assessment Upper Extremity Assessment: Generalized weakness   Lower Extremity Assessment Lower Extremity Assessment: Defer to PT evaluation   Cervical / Trunk Assessment Cervical / Trunk Assessment: Kyphotic   Communication Communication Communication: Expressive difficulties (Pt describes having some trouble getting his words out)   Cognition Arousal/Alertness: Awake/alert Behavior During Therapy: WFL for tasks assessed/performed Overall Cognitive Status: Within Functional Limits for  tasks assessed                     General Comments  [name] present for session, when I discussed disposition planning she said that she did not want OT to come out to their house because her husband does not follow through with exercises and stuff like that, I explained the benefits and maybe helping him use some of the bathroom equipment (tub bench or shower chair) and fall prevention and she continued to decline.    Exercises       Shoulder Instructions      Home Living Family/patient expects to be discharged to:: Private residence Living Arrangements: Spouse/significant other Available Help at Discharge: Family;Available 24 hours/day Type of Home: Other(Comment) (Condo) Home Access: Level entry (but wife has to pull up car to curb so he can get out)     Home Layout: One level     Bathroom Shower/Tub: Other (comment) ([name] sponge bathes at baseline)   Bathroom Toilet: Standard Bathroom Accessibility: Yes How Accessible: Accessible via walker Home Equipment: Walker - 4 wheels;Walker - 2 wheels;Wheelchair - manual;Tub bench;Grab bars - toilet;Grab bars - tub/shower;Shower seat;Hand held shower head          Prior Functioning/Environment Level of Independence: Needs assistance  Gait / Transfers Assistance Needed: reports using rollator walker primarily, will walk around house holding furniture at times.  ADL's / Homemaking Assistance Needed: Spouse assist with bathing, pt does own dressing except on Sundays for church.             OT Problem List: Decreased strength;Decreased activity tolerance;Impaired balance (sitting and/or standing);Impaired vision/perception;Decreased knowledge of use of DME or AE;Decreased safety awareness      OT Treatment/Interventions:      OT Goals(Current goals can be found in the care plan section) Acute Rehab OT Goals Patient Stated Goal: get back home OT Goal Formulation: With patient/family Time For Goal Achievement:  04/09/16 Potential to Achieve Goals: Good  OT Frequency:     Barriers to D/C: Decreased caregiver support ([name] reports that she works)  Educated wife on need for someone being there with her husband when she goes to work       Research scientist (life sciences) of Journalist, newspaper During Treatment: Careers adviser Communication: Mobility status  Activity Tolerance: Patient tolerated treatment well Patient left: in bed;with call bell/phone within reach;with family/visitor present;with bed alarm set  OT Visit Diagnosis: Unsteadiness on feet (R26.81);History of falling (Z91.81)                ADL either performed or assessed with clinical judgement  Time: 1610-9604 OT Time Calculation (min): 36 min Charges:  OT General Charges $OT Visit: 1 Procedure OT Evaluation $OT Eval Moderate Complexity: 1 Procedure OT Treatments $Self Care/Home Management : 8-22 mins G-Codes:     Sherryl Manges OTR/L [phone]   Evern Bio Carlos Heber  04/02/2016, 6:07 PM

## 2016-04-03 ENCOUNTER — Telehealth: Payer: Self-pay | Admitting: Internal Medicine

## 2016-04-03 DIAGNOSIS — Z8673 Personal history of transient ischemic attack (TIA), and cerebral infarction without residual deficits: Secondary | ICD-10-CM | POA: Diagnosis not present

## 2016-04-03 DIAGNOSIS — R531 Weakness: Secondary | ICD-10-CM | POA: Diagnosis not present

## 2016-04-03 NOTE — Telephone Encounter (Signed)
[facility] Started on Lipitor and Aspirin.

## 2016-04-03 NOTE — Consult Note (Signed)
            [facility] CM Primary Care Navigator  04/03/2016  Cody [name] Aug 22, 1940 [id number]   Wentto see patient today at the bedside to identify possible discharge needs but staffreports that he was alreadydischarged.  Patient was discharged home with home health services yesterday. Primary care provider's office called (Samantha)to notify of patient's discharge and need for post hospital follow-up and transition of care (with outpatient follow-up recommendations).    Made aware to refer [facility] to [facility] management if deemed appropriate for services.   For additional questions please contact:  Karin GoldenLorraine A. Reynold Mantell, BSN, RN-BC [facility] PRIMARY CARE Navigator Cell: [phone]

## 2016-04-03 NOTE — Telephone Encounter (Signed)
noted 

## 2016-04-04 ENCOUNTER — Telehealth: Payer: Self-pay | Admitting: *Deleted

## 2016-04-04 ENCOUNTER — Telehealth: Payer: Self-pay | Admitting: Internal Medicine

## 2016-04-04 LAB — URINE CULTURE

## 2016-04-04 NOTE — Telephone Encounter (Signed)
Requesting verbals 1 week 1, 2 week 4, 1 week 2 for symptoms of stroke after hospital discharge.

## 2016-04-04 NOTE — Telephone Encounter (Signed)
Transition Care Management Follow-up Telephone Call   Date discharged? 04/02/16   How have you been since you were released from the hospital? Pt states he is doing fine   Do you understand why you were in the hospital? YES   Do you understand the discharge instructions? YES   Where were you discharged to? Home   Items Reviewed:  Medications reviewed: YES  Allergies reviewed: YES  Dietary changes reviewed: NO  Referrals reviewed: NO   Functional Questionnaire:   Activities of Daily Living (ADLs):   He states he are independent in the following: bathing and hygiene, feeding, continence, grooming and dressing States he require assistance with the following: ambulation   Any transportation issues/concerns?: NO   Any patient concerns? NO   Confirmed importance and date/time of follow-up visits scheduled YES, APPT 04/18/16  Provider Appointment booked with Dr. Lawerance BachBurns  Confirmed with patient if condition begins to worsen call PCP or go to the ER.  Patient was given the office number and encouraged to call back with question or concerns.  : NO

## 2016-04-04 NOTE — Telephone Encounter (Signed)
LVM with Sree giving verbal orders per MD

## 2016-04-07 ENCOUNTER — Other Ambulatory Visit: Payer: Self-pay

## 2016-04-07 NOTE — Patient Outreach (Signed)
[facility]) Care Management  04/07/2016  Colbert CoyerJohn E Zoss December 09, 1940 [id number]   Patient triggered RED on EMMI Stroke Dashboard, notification sent to:  Donato Schultzrystal Hutchinson, RN

## 2016-04-07 NOTE — Patient Outreach (Addendum)
[facility]) Care Management  04/07/2016  Cody [name] 08-07-1940 [id number]  Emmi Stroke, Screening, Initial Assessment   Referral Date:  04/07/16 Source:  Emmi Stroke Issue:  Been able to take every dose of meds? No [facility] eligibility:  Yes   Outreach call #1 to patient.  Patient reached and completed call.    Providers: PCP:  Dr. [name]:  Next appt 04/18/16 H/o Transition of Care Services completed with provider. 04/04/16  Hospital Admission:  03/31/2016 - 04/02/2016 - Stroke (cerebrum) Active Problems:  Hyperlipidemia, Interstitial cystitis 04/02/16 Discharge Recommendations:  1. Follow up with PCP in 1-2 weeks for ongoing risk factor modification. Started lipitor 80mg  and aspirin 325mg  per neuro recommendations.  2. Follow up with urology: Pt had hematuria while inpatient without anemia. After discussion with neurology and patient, will hold aspirin until urine clears.  3. Will follow up with Dr. Pearlean Brownie in 2 months.  4. Please follow up on urine culture ordered on day of discharge. Home Health: PT Equipment/Devices: Has walker at home Diet recommendation: Heart healthy  Encounter Medications:  Outpatient Encounter Prescriptions as of 04/07/2016  Medication Sig  . atorvastatin (LIPITOR) 80 MG tablet Take 1 tablet (80 mg total) by mouth daily at 6 PM.  . docusate sodium (COLACE) 100 MG capsule Take 100 mg by mouth every evening.  . hydrOXYzine (ATARAX/VISTARIL) 25 MG tablet Take 12.5 mg by mouth every other day.  . Multiple Vitamins-Minerals (ADULT ONE DAILY GUMMIES) CHEW Chew 2 each by mouth every evening.   . pentosan polysulfate (ELMIRON) 100 MG capsule Take 2 capsules (200 mg total) by mouth daily. (Patient taking differently: Take 100 mg by mouth every evening. )  . solifenacin (VESICARE) 10 MG tablet Take 10 mg by mouth every evening.  . tamsulosin (FLOMAX) 0.4 MG CAPS Take 1 capsule (0.4 mg total) by mouth daily. (Patient taking differently: Take 0.4 mg by  mouth daily after supper. )  . aspirin EC 325 MG EC tablet Take 1 tablet (325 mg total) by mouth daily. (Patient not taking: Reported on 04/07/2016)   No facility-administered encounter medications on file as of 04/07/2016.     Functional Status:  In your present state of health, do you have any difficulty performing the following activities: 04/07/2016 04/01/2016  Hearing? N -  Vision? N -  Difficulty concentrating or making decisions? Y -  Walking or climbing stairs? Y -  Dressing or bathing? Y -  Doing errands, shopping? Malvin Johns  Preparing Food and eating ? N -  Using the Toilet? N -  In the past six months, have you accidently leaked urine? N -  Do you have problems with loss of bowel control? N -  Managing your Medications? N -  Managing your Finances? N -  Housekeeping or managing your Housekeeping? N -  Some recent data might be hidden    Fall/Depression Screening: PHQ 2/9 Scores 04/07/2016  PHQ - 2 Score 0      Fall Risk  04/07/2016  Falls in the past year? No     Assessment Patient is able to verify the above plan.  Patient reports no issues since discharge.  States he understands his medications and is taking as ordered including holding the Aspirin. Patient agreed to continue to [facility] for further Stroke education services.    Plan:   Referral Date:  04/07/16 Emmi Stroke, Screening, Initial Assessment: 04/07/16 RN CM Telephonic Services:  04/07/16 Program:  Stroke 04/07/16  RN CM advised Emmi  Education Materials to be mailed over the next 30 days.    RN CM advised in next [facility] scheduled contact call within next 30 days for monthly assessment and / or care coordination services as needed.  Advised patient will continue to receive Emmi Stroke automated calls.  RN CM advised to please notify MD of any changes in condition prior to scheduled appt's.   RN CM provided contact name and # [phone] or main office # [phone] and 24-hour nurse line # [phone].  RN CM  confirmed patient is aware of 911 services for urgent emergency needs.  RN CM sent successful outreach letter and  [facility] Introductory package. RN CM sent Physician Enrollment/Barriers Letter and Initial Assessment to Primary MD RN CM notified [facility] Care Management Assistant: agreed to services/case opened.  Simmie Daviesrystal Kyree Fedorko, MSHL, BSN, RN, CCM  [facility] Care Management Care Management Coordinator [phone] Direct [phone] Cell [phone] Office [phone] Fax [email or web address]

## 2016-04-08 ENCOUNTER — Other Ambulatory Visit: Payer: Self-pay

## 2016-04-08 DIAGNOSIS — Z8673 Personal history of transient ischemic attack (TIA), and cerebral infarction without residual deficits: Secondary | ICD-10-CM | POA: Diagnosis not present

## 2016-04-08 DIAGNOSIS — R531 Weakness: Secondary | ICD-10-CM | POA: Diagnosis not present

## 2016-04-08 NOTE — Patient Outreach (Addendum)
[facility]) Care Management  04/08/2016  Cody CoyerJohn E [name] 19-Dec-1940 [id number]   Emmi Education Materials (Mailed 04/08/16) - Preventing A Second Stroke:  Know The Signs Of Stroke - Signs of Stroke - Kirkpatrick Stroke Early Stages of Recovery Booklet  Cody Schultzrystal Chelesea Weiand, Cody [name], BSN, Cody [name], Cody [name]  [facility] Care Management Care Management Coordinator [phone] Direct [phone] Cell [phone] Office [phone] Fax Cody [name].Cody [name]

## 2016-04-10 DIAGNOSIS — R531 Weakness: Secondary | ICD-10-CM | POA: Diagnosis not present

## 2016-04-10 DIAGNOSIS — Z8673 Personal history of transient ischemic attack (TIA), and cerebral infarction without residual deficits: Secondary | ICD-10-CM | POA: Diagnosis not present

## 2016-04-15 DIAGNOSIS — Z8673 Personal history of transient ischemic attack (TIA), and cerebral infarction without residual deficits: Secondary | ICD-10-CM | POA: Diagnosis not present

## 2016-04-15 DIAGNOSIS — Z7982 Long term (current) use of aspirin: Secondary | ICD-10-CM | POA: Diagnosis not present

## 2016-04-15 DIAGNOSIS — R531 Weakness: Secondary | ICD-10-CM | POA: Diagnosis not present

## 2016-04-17 NOTE — Progress Notes (Signed)
Subjective:    Patient ID: Cody [name], male    DOB: Apr 05, 1940, 76 y.o.   MRN: [id number]  HPI The patient is here for follow up from the hospital.  Hospitalized 03/31/16 - 04/02/16.  Recommendations for Outpatient Follow-up:  1. Follow up with PCP in 1-2 weeks for ongoing risk factor modification. Started lipitor  and aspirin  per neuro recommendations.  2. Follow up with urology: Pt had hematuria while inpatient without anemia. After discussion with neurology and patient, will hold aspirin until urine clears.  3. Will follow up with Dr. Pearlean Brownie in 2 months. Patient has a 10-15% risk of having another stroke over the next year, the highest risk is within 2 weeks of the most recent stroke/TIA. 4. Please follow up on urine culture ordered on day of discharge   He awoke at 6 am the day of admission with right hand numbness/heaviness and garbled speech that did not make sense. He was taken to ED and speech symptoms had resolved.  He had a negative UDS, UA, WBC normal and basic blood work normal.  BP was high 209/104.  CXR normal.  CT head was neg.  MRI/MRA of brain showed two punctate areas of acute ischemia.  Neurology was consulted.  Echo showed no source of embolism, carotid arteries showed no high grade stenosis. He had PT.  All neurological deficits resolved.  He was stated on statin and ASA 325 mg.  He was given neurology follow up.  On day of discharge he developed gross hematuria. He was to follow up with Dr Mena Goes and was told to hold ASA until hematuria resolved.  The hematuria lasted no more than two days.  He has not seen it since.  He denies urinary symptoms and does not feel he needs to f/u with urology.    BP today by PT 120/88.  Highest 140/84.  He denies chest pain, palpitations, edema, SOB, headaches and lightheadedness.  He is very sedentary.   Recent CVA:  He is taking the ASA and stating daily.  He is doing PT at home.  He spends all day in bed and eats all of  his meals in bed.  He is very inactive and does not seem motivated to become more active.  He has a follow up scheduled for neurology in two months.  He denies difficulty with speech or swallowing, numbness/tingling or headaches.     Medications and allergies reviewed with patient and updated if appropriate.  Patient Active Problem List   Diagnosis Date Noted  . Stroke (cerebrum) (HCC) 03/31/2016  . Interstitial cystitis 03/31/2016  . Physical deconditioning 02/10/2015  . Weakness generalized 02/10/2015  . Elevated blood pressure 02/10/2015  . Hyperglycemia 02/09/2015  . ANAL OR RECTAL PAIN 11/09/2007  . Hyperlipidemia 04/09/2007  . HEMORRHOIDS 04/09/2007  . DIVERTICULOSIS, COLON 04/09/2007  . INTERSTITIAL CYSTITIS 04/09/2007  . BPH (benign prostatic hyperplasia) 04/09/2007    Current Outpatient Prescriptions on File Prior to Visit  Medication Sig Dispense Refill  . aspirin EC 325 MG EC tablet Take 1 tablet (325 mg total) by mouth daily. 30 tablet 0  . atorvastatin (LIPITOR) 80 MG tablet Take 1 tablet (80 mg total) by mouth daily at 6 PM. 30 tablet 0  . docusate sodium (COLACE) 100 MG capsule Take 100 mg by mouth every evening.    . hydrOXYzine (ATARAX/VISTARIL) 25 MG tablet Take 12.5 mg by mouth every other day.    . Multiple Vitamins-Minerals (ADULT ONE DAILY GUMMIES) CHEW  Chew 2 each by mouth every evening.     . pentosan polysulfate (ELMIRON) 100 MG capsule Take 2 capsules (200 mg total) by mouth daily. (Patient taking differently: Take 100 mg by mouth every evening. )    . solifenacin (VESICARE) 10 MG tablet Take 10 mg by mouth every evening.    . tamsulosin (FLOMAX) 0.4 MG CAPS Take 1 capsule (0.4 mg total) by mouth daily. (Patient taking differently: Take 0.4 mg by mouth daily after supper. ) 30 capsule 3   No current facility-administered medications on file prior to visit.     Past Medical History:  Diagnosis Date  . Hyperlipidemia   . Interstitial cystitis   .  Scarlet fever 11/09/2007   Annotation: as a child, no sequellae Qualifier: History of  By: Debby Bud MD, Rosalyn Gess   . Stroke (HCC)   . WHOOPING COUGH 11/10/2007   Qualifier: History of  By: Debby Bud MD, Rosalyn Gess     Past Surgical History:  Procedure Laterality Date  . CYSTOSTOMY W/ BLADDER BIOPSY     2  . INGUINAL HERNIA REPAIR Bilateral   . KNEE ARTHROSCOPY Left   . TONSILLECTOMY  1948    Social History   Social History  . Marital status: Married    Spouse name: N/A  . Number of children: N/A  . Years of education: N/A   Social History Main Topics  . Smoking status: Never Smoker  . Smokeless tobacco: Never Used  . Alcohol use No  . Drug use: No  . Sexual activity: Not on file   Other Topics Concern  . Not on file   Social History Narrative  . No narrative on file    Family History  Problem Relation Age of Onset  . Colon cancer Mother   . Alcohol abuse Father     Review of Systems  Constitutional: Negative for chills and fever.  HENT: Negative for trouble swallowing.   Respiratory: Positive for wheezing (occ). Negative for cough and shortness of breath.   Cardiovascular: Negative for chest pain, palpitations and leg swelling.  Gastrointestinal: Negative for abdominal pain.       Gerd  Genitourinary: Negative for difficulty urinating, dysuria and hematuria.  Neurological: Negative for speech difficulty, weakness, light-headedness, numbness and headaches.       Objective:   Vitals:   04/18/16 1614  BP: (!) 150/94  Pulse: 100  Resp: 16  Temp: 98.1 F (36.7 C)   Wt Readings from Last 3 Encounters:  04/18/16 194 lb (88 kg)  03/31/16 177 lb (80.3 kg)  03/04/10 177 lb (80.3 kg)   Body mass index is 29.5 kg/m.   Physical Exam    Constitutional: Appears well-developed and well-nourished. No distress.  HENT:  Head: Normocephalic and atraumatic.  Neck: Neck supple. No tracheal deviation present. No thyromegaly present.  No cervical  lymphadenopathy Cardiovascular: Normal rate, regular rhythm and normal heart sounds.   No murmur heard. No carotid bruit .  No edema Pulmonary/Chest: Effort normal and breath sounds normal. No respiratory distress. No has no wheezes. No rales.  Skin: Skin is warm and dry. Not diaphoretic.  Psychiatric: Normal mood and affect. Behavior is normal.      Assessment & Plan:    See Problem List for Assessment and Plan of chronic medical problems.

## 2016-04-18 ENCOUNTER — Ambulatory Visit (INDEPENDENT_AMBULATORY_CARE_PROVIDER_SITE_OTHER): Payer: Medicare Other | Admitting: Internal Medicine

## 2016-04-18 ENCOUNTER — Other Ambulatory Visit: Payer: Self-pay

## 2016-04-18 VITALS — BP 150/94 | HR 100 | Temp 98.1°F | Resp 16 | Wt 194.0 lb

## 2016-04-18 DIAGNOSIS — E785 Hyperlipidemia, unspecified: Secondary | ICD-10-CM | POA: Diagnosis not present

## 2016-04-18 DIAGNOSIS — I6300 Cerebral infarction due to thrombosis of unspecified precerebral artery: Secondary | ICD-10-CM

## 2016-04-18 DIAGNOSIS — I639 Cerebral infarction, unspecified: Secondary | ICD-10-CM

## 2016-04-18 DIAGNOSIS — N3010 Interstitial cystitis (chronic) without hematuria: Secondary | ICD-10-CM | POA: Diagnosis not present

## 2016-04-18 DIAGNOSIS — Z23 Encounter for immunization: Secondary | ICD-10-CM

## 2016-04-18 DIAGNOSIS — R531 Weakness: Secondary | ICD-10-CM | POA: Diagnosis not present

## 2016-04-18 DIAGNOSIS — Z8673 Personal history of transient ischemic attack (TIA), and cerebral infarction without residual deficits: Secondary | ICD-10-CM | POA: Diagnosis not present

## 2016-04-18 DIAGNOSIS — B351 Tinea unguium: Secondary | ICD-10-CM

## 2016-04-18 DIAGNOSIS — I10 Essential (primary) hypertension: Secondary | ICD-10-CM

## 2016-04-18 MED ORDER — ASPIRIN 325 MG PO TBEC: 325.0000 mg | DELAYED_RELEASE_TABLET | Freq: Every day | ORAL | 3 refills | Status: AC

## 2016-04-18 MED ORDER — AMLODIPINE BESYLATE 2.5 MG PO TABS: 2.5000 mg | ORAL_TABLET | Freq: Every day | ORAL | 3 refills | Status: DC

## 2016-04-18 MED ORDER — ATORVASTATIN CALCIUM 80 MG PO TABS: 80.0000 mg | ORAL_TABLET | Freq: Every day | ORAL | 3 refills | Status: DC

## 2016-04-18 NOTE — Patient Instructions (Addendum)
   Medications reviewed and updated.  Changes include starting amlodipine 2.5 mg daily for your blood pressure.  Your prescription(s) have been submitted to your pharmacy. Please take as directed and contact our office if you believe you are having problem(s) with the medication(s).  A referral was ordered for podiatry.    Please followup in 6 motnhs

## 2016-04-18 NOTE — Progress Notes (Signed)
Pre visit review using our clinic review tool, if applicable. No additional management support is needed unless otherwise documented below in the visit note. 

## 2016-04-18 NOTE — Patient Outreach (Signed)
[facility]) Care Management  04/18/2016  Cody [name] 14-Jul-1940 [id number]   Telephone call to patient for EMMI red dashboard on 04-17-16 for Question- Went to follow up appointment.  Patient answered no.  Spoke with patient he is able to verify HIPAA.  Patient states that he remembers call yesterday.  He states he answered no because he had not had his follow up appointment with his primary care physician. Patient reports that he sees [name] today for follow-up appointment. Patient reports he is doing well and offers no questions or concerns.  [name], RN, MSN [facility] Care Management RN Telephonic Health Coach [phone]

## 2016-04-18 NOTE — Assessment & Plan Note (Addendum)
Had 24-48 hrs of hematuria Resolved Advised follow up with urology

## 2016-04-18 NOTE — Patient Outreach (Signed)
Patient triggered red on EMMI Strokd Dashboard, notification sent to George Ina, RN

## 2016-04-19 ENCOUNTER — Encounter: Payer: Self-pay | Admitting: Internal Medicine

## 2016-04-19 DIAGNOSIS — B351 Tinea unguium: Secondary | ICD-10-CM | POA: Insufficient documentation

## 2016-04-19 DIAGNOSIS — I10 Essential (primary) hypertension: Secondary | ICD-10-CM | POA: Insufficient documentation

## 2016-04-19 NOTE — Assessment & Plan Note (Signed)
LDL goal < 70 given recent CVA Continue Lipitor 80 mg daily Will need cmp, lipid panel in 6-8 weeks - when he sees neurology

## 2016-04-19 NOTE — Assessment & Plan Note (Signed)
Has very long, thickened toe nails secondary to fungal infection per wife - she is not able to cut nails Referred to podiatry

## 2016-04-19 NOTE — Assessment & Plan Note (Signed)
Discussed need to have BP very well controlled given recent stroke and risk of having another stroke Start amlodipine 2.5 mg daily - may need to be titrated

## 2016-04-19 NOTE — Assessment & Plan Note (Addendum)
Neurology f/u schedule Continue ASA 325 mg and lipitor 80 mg daily BP not ideally controlled - start amlodipine 2.5 mg daily - may need to be titrated.  Stressed importance of good BP control - medication may need to be titrated Continue PT - encouraged increased activity

## 2016-04-22 DIAGNOSIS — R531 Weakness: Secondary | ICD-10-CM | POA: Diagnosis not present

## 2016-04-22 DIAGNOSIS — Z8673 Personal history of transient ischemic attack (TIA), and cerebral infarction without residual deficits: Secondary | ICD-10-CM | POA: Diagnosis not present

## 2016-04-25 DIAGNOSIS — Z8673 Personal history of transient ischemic attack (TIA), and cerebral infarction without residual deficits: Secondary | ICD-10-CM | POA: Diagnosis not present

## 2016-04-25 DIAGNOSIS — R531 Weakness: Secondary | ICD-10-CM | POA: Diagnosis not present

## 2016-04-29 DIAGNOSIS — R531 Weakness: Secondary | ICD-10-CM | POA: Diagnosis not present

## 2016-04-29 DIAGNOSIS — Z8673 Personal history of transient ischemic attack (TIA), and cerebral infarction without residual deficits: Secondary | ICD-10-CM | POA: Diagnosis not present

## 2016-05-02 DIAGNOSIS — R531 Weakness: Secondary | ICD-10-CM | POA: Diagnosis not present

## 2016-05-02 DIAGNOSIS — Z8673 Personal history of transient ischemic attack (TIA), and cerebral infarction without residual deficits: Secondary | ICD-10-CM | POA: Diagnosis not present

## 2016-05-05 ENCOUNTER — Ambulatory Visit: Payer: Medicare Other

## 2016-05-05 ENCOUNTER — Other Ambulatory Visit: Payer: Self-pay

## 2016-05-05 NOTE — Patient Outreach (Signed)
[facility]) Care Management  05/05/2016  Cody [name] 1940/05/05 [id number]   Telephone Assessment     Outreach attempt #1 to patient. Spoke with patient. He states he is doing fairly well. He denies any new issues or concerns.  Social: Patient continues to reside with his spouse. He stets she is assisting him with his care needs. Spouse taking patient back and firth to appts. [facility]. He reports therapist coming once a week and will continue to come for a few more weeks. Noted in MD notes that patient not being very active and lying in bed a lot. Encouraged patient to practice exercises and therapy strategies that PT has given him to improve strength and overall recovery.  Conditions: Patient continues to recover from recent stroke. patient also suffers from HTN and not as controlled as MD would like it. RN CM provided education to patient on HTN management. Patient states that therapist checking BP prior to sessions. Encouraged patient to monitor BP in the home as well. He states he has a machine and will get wife to assist with checking BP. RN CM reviewed with patient s/s of stroke. Patient able to verbalize some s/s of worsening condition and what to do.   Appointments: Patient completed PCP appt on 04/18/16. He states he has stroke f/u appt in the beginning of June.   Medications: Patient denies any issues with meds. MD did make BP med adjustments during last appt.   Plan: RN CM will make outreach attempt to patient within a month.  Antionette Fairy, RN,BSN,CCM [facility] Care Management Telephonic Care Management Coordinator Direct Phone: [phone] Toll Free: [phone] Fax: [phone]

## 2016-05-07 DIAGNOSIS — Z8673 Personal history of transient ischemic attack (TIA), and cerebral infarction without residual deficits: Secondary | ICD-10-CM | POA: Diagnosis not present

## 2016-05-07 DIAGNOSIS — R531 Weakness: Secondary | ICD-10-CM | POA: Diagnosis not present

## 2016-05-14 ENCOUNTER — Telehealth: Payer: Self-pay | Admitting: Internal Medicine

## 2016-05-14 DIAGNOSIS — Z8673 Personal history of transient ischemic attack (TIA), and cerebral infarction without residual deficits: Secondary | ICD-10-CM | POA: Diagnosis not present

## 2016-05-14 DIAGNOSIS — R531 Weakness: Secondary | ICD-10-CM | POA: Diagnosis not present

## 2016-05-14 NOTE — Telephone Encounter (Signed)
Spoke with Sree to give verbal orders per MD 

## 2016-05-14 NOTE — Telephone Encounter (Signed)
Needs verbals to extend physical therapy for 1x2 starting next week

## 2016-05-21 ENCOUNTER — Ambulatory Visit (INDEPENDENT_AMBULATORY_CARE_PROVIDER_SITE_OTHER): Payer: Medicare Other | Admitting: Podiatry

## 2016-05-21 ENCOUNTER — Encounter: Payer: Self-pay | Admitting: Podiatry

## 2016-05-21 DIAGNOSIS — B351 Tinea unguium: Secondary | ICD-10-CM

## 2016-05-21 DIAGNOSIS — M79675 Pain in left toe(s): Secondary | ICD-10-CM

## 2016-05-21 DIAGNOSIS — M79674 Pain in right toe(s): Secondary | ICD-10-CM

## 2016-05-22 DIAGNOSIS — Z8673 Personal history of transient ischemic attack (TIA), and cerebral infarction without residual deficits: Secondary | ICD-10-CM | POA: Diagnosis not present

## 2016-05-22 DIAGNOSIS — R531 Weakness: Secondary | ICD-10-CM | POA: Diagnosis not present

## 2016-05-23 NOTE — Progress Notes (Signed)
   SUBJECTIVE Patient  presents to office today complaining of elongated, thickened nails. Pain while ambulating in shoes. Patient is unable to trim their own nails. He also complains of dry skin to the lateral sides of bilateral feet.  OBJECTIVE General Patient is awake, alert, and oriented x 3 and in no acute distress. Derm Skin is dry and supple bilateral. Negative open lesions or macerations. Remaining integument unremarkable. Nails are tender, long, thickened and dystrophic with subungual debris, consistent with onychomycosis, 1-5 bilateral. No signs of infection noted. Vasc  DP and PT pedal pulses palpable bilaterally. Temperature gradient within normal limits.  Neuro Epicritic and protective threshold sensation diminished bilaterally.  Musculoskeletal Exam No symptomatic pedal deformities noted bilateral. Muscular strength within normal limits.  ASSESSMENT 1. Onychodystrophic nails 1-5 bilateral with hyperkeratosis of nails.  2. Onychomycosis of nail due to dermatophyte bilateral 3. Pain in foot bilateral  PLAN OF CARE 1. Patient evaluated today.  2. Instructed to maintain good pedal hygiene and foot care.  3. Mechanical debridement of nails 1-5 bilaterally performed using a nail nipper. Filed with dremel without incident.  4. Recommended Revitaderm lotion. 5. Return to clinic in 3 mos.    Felecia ShellingBrent M. Evans, DPM [facility]  Dr. Felecia ShellingBrent M. Evans, DPM    [address]                                        [place], [place] [address]                Office [phone]  Fax [phone]

## 2016-05-26 ENCOUNTER — Other Ambulatory Visit: Payer: Self-pay

## 2016-05-26 NOTE — Patient Outreach (Signed)
[facility]) Care Management  05/26/2016  Cody [name] 19-Oct-1940 [id number]   Case Closure    Outreach attempt #1 to patient. Spoke with patient. He is pleased to report that he has been doing well over the past month. He reports the only ppt he has had is with his podiatrist for toenail clipping. He states he is unsure when next PCP appt but will check on that. Patient speaks highly of his PCP and how he enjoys his visits with her. He denies any changes in his condition./ He is now only getting physical therapy once a week. Patient states he feels he has made progress and is getting stronger. He continues to use walker to assist with ambulation. Patient states that he had a grandson to graduate from [employer] over the weekend. He was unable to attend but watched it via the Internet. He reports that he feels so "blessed" to still be alive and get to seer the accomplishments of his children and grandchildren. patient aware of s/s of stroke and when to seek medical attention. Patient voices having no further RN CM needs or concerns. Goals have been met for patient. He is in agreement of case closure. Patient has [facility] contact info and will call if any future needs arise.   Plan: RN CM will notify [facility] administrative assistant of case status. RN CM will send MD case closure letter.  Enzo Montgomery, RN,BSN,CCM [facility] Telephonic Care Management Coordinator Direct Phone: [phone] Toll Free: [phone] Fax: [phone]

## 2016-05-28 DIAGNOSIS — Z8673 Personal history of transient ischemic attack (TIA), and cerebral infarction without residual deficits: Secondary | ICD-10-CM | POA: Diagnosis not present

## 2016-05-28 DIAGNOSIS — R531 Weakness: Secondary | ICD-10-CM | POA: Diagnosis not present

## 2016-06-02 DIAGNOSIS — I69398 Other sequelae of cerebral infarction: Secondary | ICD-10-CM | POA: Diagnosis not present

## 2016-06-02 DIAGNOSIS — R531 Weakness: Secondary | ICD-10-CM | POA: Diagnosis not present

## 2016-06-03 DIAGNOSIS — R531 Weakness: Secondary | ICD-10-CM | POA: Diagnosis not present

## 2016-06-03 DIAGNOSIS — I69398 Other sequelae of cerebral infarction: Secondary | ICD-10-CM | POA: Diagnosis not present

## 2016-06-06 DIAGNOSIS — R531 Weakness: Secondary | ICD-10-CM | POA: Diagnosis not present

## 2016-06-06 DIAGNOSIS — I69398 Other sequelae of cerebral infarction: Secondary | ICD-10-CM | POA: Diagnosis not present

## 2016-06-12 DIAGNOSIS — R531 Weakness: Secondary | ICD-10-CM | POA: Diagnosis not present

## 2016-06-12 DIAGNOSIS — I69398 Other sequelae of cerebral infarction: Secondary | ICD-10-CM | POA: Diagnosis not present

## 2016-06-13 DIAGNOSIS — Z8673 Personal history of transient ischemic attack (TIA), and cerebral infarction without residual deficits: Secondary | ICD-10-CM | POA: Diagnosis not present

## 2016-06-13 DIAGNOSIS — Z7982 Long term (current) use of aspirin: Secondary | ICD-10-CM | POA: Diagnosis not present

## 2016-06-13 DIAGNOSIS — I69398 Other sequelae of cerebral infarction: Secondary | ICD-10-CM | POA: Diagnosis not present

## 2016-06-13 DIAGNOSIS — R531 Weakness: Secondary | ICD-10-CM | POA: Diagnosis not present

## 2016-06-16 ENCOUNTER — Ambulatory Visit (INDEPENDENT_AMBULATORY_CARE_PROVIDER_SITE_OTHER): Payer: Medicare Other | Admitting: Neurology

## 2016-06-16 ENCOUNTER — Encounter: Payer: Self-pay | Admitting: Neurology

## 2016-06-16 VITALS — BP 137/77 | HR 66 | Wt 193.2 lb

## 2016-06-16 DIAGNOSIS — I69398 Other sequelae of cerebral infarction: Secondary | ICD-10-CM | POA: Diagnosis not present

## 2016-06-16 DIAGNOSIS — I639 Cerebral infarction, unspecified: Secondary | ICD-10-CM

## 2016-06-16 DIAGNOSIS — R531 Weakness: Secondary | ICD-10-CM | POA: Diagnosis not present

## 2016-06-16 DIAGNOSIS — I6381 Other cerebral infarction due to occlusion or stenosis of small artery: Secondary | ICD-10-CM

## 2016-06-16 NOTE — Progress Notes (Signed)
[facility] [address] [place]. [place] [address] [phone]       OFFICE FOLLOW-UP NOTE  Mr. Cody [name] Date of Birth:  11-14-1940 Medical Record Number:  [id number]   HPI: Mr. Capek is a 76 year old Caucasian male seen today for first office follow-up visit following hospital admission for stroke in March 2018. He is accompanied by his wife. History is obtained from the patient, wife and review of Hospital medical records. I personally reviewed imaging films.He is 76 year old right-handed man who presented to the emergency department on 03/31/16  for evaluation of some speech changes. History is obtained directly from the patient  Who was a fair historian. His wife was present at the bedside and offered additional information is needed.It sounds as if the patient was in his usual state of health until he woke up this morning. His wife reports that he got up at about 6:00 03/31/16 morning to go to the bathroom and complained of some numbness in his right hand. He also noticed that he was having difficulty with his language. Specifically, he felt like he was in difficulty getting his words out and his wife reports that his language was nonsensical at times. This appears to have resolved and he is presently not endorsing any of these symptoms. It is unclear to me how long symptoms lasted as he is not able to tell me when exactly they resolved. He has never had any similar previous symptoms. He denies any vision changes, difficulty swallowing, slurred speech, new weakness, problems with coordination, or problems with balance.Last known well: 2300 03/30/16 NIHSS score: tPA given?: No, symptoms resolved, outside of window . MRI scan of the brain showed 2 tiny frontal parietal white matter lacunar infarcts. MRA showed no large vessel stenosis or occlusion. Carotid Doppler showed 1-39% right ICA and 40-59% left ICA stenosis. Transthoracic echo showed normal ejection fraction. LDL  cholesterol was 127 mg percent and hemoglobin A1c was 5.5. Patient was started on aspirin for stroke prevention and on Lipitor for elevated lipids. Patient's symptoms resolved completely by the time of discharge. He states is currently getting home physical therapy and use a walker for outdoor 7 long distances. He can walk without help in dose. He's had no more falls but prior to his TIA and had several falls. States his blood pressure is well controlled and today it is 137/77. He has not had an follow-up lipid profile checked but is tolerating his statin without myalgias or arthralgias. He does have an upcoming appointment with urology for interstitial cystitis of his bladder. He has no new neurological complaints.   14 system review of systems is positive for  cough, incontinence, urination problems, importance and all other systems negative PMH:  Past Medical History:  Diagnosis Date  . Hyperlipidemia   . Interstitial cystitis   . Scarlet fever 11/09/2007   Annotation: as a child, no sequellae Qualifier: History of  By: Debby Bud MD, Rosalyn Gess   . Stroke (HCC)   . WHOOPING COUGH 11/10/2007   Qualifier: History of  By: Debby Bud MD, Rosalyn Gess     Social History:  Social History   Social History  . Marital status: Married    Spouse name: N/A  . Number of children: N/A  . Years of education: N/A   Occupational History  . Not on file.   Social History Main Topics  . Smoking status: Never Smoker  . Smokeless tobacco: Never Used  . Alcohol use No  . Drug use:  No  . Sexual activity: Not on file   Other Topics Concern  . Not on file   Social History Narrative  . No narrative on file    Medications:   Current Outpatient Prescriptions on File Prior to Visit  Medication Sig Dispense Refill  . amLODipine (NORVASC) 2.5 MG tablet Take 1 tablet (2.5 mg total) by mouth daily. 90 tablet 3  . aspirin 325 MG EC tablet Take 1 tablet (325 mg total) by mouth daily. 90 tablet 3  . atorvastatin  (LIPITOR) 80 MG tablet Take 1 tablet (80 mg total) by mouth daily at 6 PM. 90 tablet 3  . docusate sodium (COLACE) 100 MG capsule Take 100 mg by mouth every evening.    . hydrOXYzine (ATARAX/VISTARIL) 25 MG tablet Take 12.5 mg by mouth every other day.    . Multiple Vitamins-Minerals (ADULT ONE DAILY GUMMIES) CHEW Chew 2 each by mouth every evening.     . pentosan polysulfate (ELMIRON) 100 MG capsule Take 2 capsules (200 mg total) by mouth daily. (Patient taking differently: Take 100 mg by mouth every evening. )    . solifenacin (VESICARE) 10 MG tablet Take 10 mg by mouth every evening.    . tamsulosin (FLOMAX) 0.4 MG CAPS Take 1 capsule (0.4 mg total) by mouth daily. (Patient taking differently: Take 0.4 mg by mouth daily after supper. ) 30 capsule 3   No current facility-administered medications on file prior to visit.     Allergies:  No Known Allergies  Physical Exam General: well developed, well nourished elderly Caucasian male, seated, in no evident distress Head: head normocephalic and atraumatic.  Neck: supple with soft left  carotid   bruit Cardiovascular: regular rate and rhythm, soft ejection murmur Musculoskeletal: no deformity Skin:  no rash/petichiae Vascular:  Normal pulses all extremities Vitals:   06/16/16 1308  BP: 137/77  Pulse: 66   Neurologic Exam Mental Status: Awake and fully alert. Oriented to place and time. Recent and remote memory intact. Attention span, concentration and fund of knowledge appropriate. Mood and affect appropriate.  Cranial Nerves: Fundoscopic exam reveals sharp disc margins. Pupils equal, briskly reactive to light. Extraocular movements full without nystagmus. Visual fields full to confrontation. Hearing intact. Facial sensation intact. Face, tongue, palate moves normally and symmetrically.  Motor: Normal bulk and tone. Normal strength in all tested extremity muscles. Sensory.: intact to touch ,pinprick .position and vibratory sensation.    Coordination: Rapid alternating movements normal in all extremities. Finger-to-nose and heel-to-shin performed accurately bilaterally. Gait and Station: Arises from chair without difficulty. Stance is stooped. Gait demonstrates wide bases and slght imbalance and uses a walker . Unble to heel, toe and tandem walk. Reflexes: 1+ and symmetric. Toes downgoing.   NIHSS  0 Modified Rankin  1  ASSESSMENT: [age] year Caucasian male with left parietal lacunar infarct secondary to small vessel disease in March 2018. Vascular risk factors of hypertension hyperlipidemia and age.    PLAN: I had a long d/w patient about his recent lacunar stroke, risk for recurrent stroke/TIAs, personally independently reviewed imaging studies and stroke evaluation results and answered questions.Continue aspirin 325 mg daily  for secondary stroke prevention and maintain strict control of hypertension with blood pressure goal below 130/90, diabetes with hemoglobin A1c goal below 6.5% and lipids with LDL cholesterol goal below 70 mg/dL. I also advised the patient to eat a healthy diet with plenty of whole grains, cereals, fruits and vegetables, exercise regularly and maintain ideal body weight .I advised the  patient fall and safety precautions and to use a walker at all times .Followup in the future with my nurse practitioner in 6 months or call earlier if necessary Greater than 50% of time during this 25 minute visit was spent on counseling,explanation of diagnosis, planning of further management, discussion with patient and family and coordination of care Delia Heady, MD  [facility] [address] [place], [place] [address]  Phone [phone] Fax [phone] Note: This document was prepared with digital dictation and possible smart phrase technology. Any transcriptional errors that result from this process are unintentional

## 2016-06-16 NOTE — Patient Instructions (Signed)
I had a long d/w patient about his recent lacunar stroke, risk for recurrent stroke/TIAs, personally independently reviewed imaging studies and stroke evaluation results and answered questions.Continue aspirin 325 mg daily  for secondary stroke prevention and maintain strict control of hypertension with blood pressure goal below 130/90, diabetes with hemoglobin A1c goal below 6.5% and lipids with LDL cholesterol goal below 70 mg/dL. I also advised the patient to eat a healthy diet with plenty of whole grains, cereals, fruits and vegetables, exercise regularly and maintain ideal body weight .I advised the patient fall and safety precautions and to use a walker at all times .Followup in the future with my nurse practitioner in 6 months or call earlier if necessary Stroke Prevention Some medical conditions and behaviors are associated with an increased chance of having a stroke. You may prevent a stroke by making healthy choices and managing medical conditions. How can I reduce my risk of having a stroke?  Stay physically active. Get at least 30 minutes of activity on most or all days.  Do not smoke. It may also be helpful to avoid exposure to secondhand smoke.  Limit alcohol use. Moderate alcohol use is considered to be: ? No more than 2 drinks per day for men. ? No more than 1 drink per day for nonpregnant women.  Eat healthy foods. This involves: ? Eating 5 or more servings of fruits and vegetables a day. ? Making dietary changes that address high blood pressure (hypertension), high cholesterol, diabetes, or obesity.  Manage your cholesterol levels. ? Making food choices that are high in fiber and low in saturated fat, trans fat, and cholesterol may control cholesterol levels. ? Take any prescribed medicines to control cholesterol as directed by your health care provider.  Manage your diabetes. ? Controlling your carbohydrate and sugar intake is recommended to manage diabetes. ? Take any  prescribed medicines to control diabetes as directed by your health care provider.  Control your hypertension. ? Making food choices that are low in salt (sodium), saturated fat, trans fat, and cholesterol is recommended to manage hypertension. ? Ask your health care provider if you need treatment to lower your blood pressure. Take any prescribed medicines to control hypertension as directed by your health care provider. ? If you are 2218-78 years of age, have your blood pressure checked every 3-5 years. If you are 78 years of age or older, have your blood pressure checked every year.  Maintain a healthy weight. ? Reducing calorie intake and making food choices that are low in sodium, saturated fat, trans fat, and cholesterol are recommended to manage weight.  Stop drug abuse.  Avoid taking birth control pills. ? Talk to your health care provider about the risks of taking birth control pills if you are over 78 years old, smoke, get migraines, or have ever had a blood clot.  Get evaluated for sleep disorders (sleep apnea). ? Talk to your health care provider about getting a sleep evaluation if you snore a lot or have excessive sleepiness.  Take medicines only as directed by your health care provider. ? For some people, aspirin or blood thinners (anticoagulants) are helpful in reducing the risk of forming abnormal blood clots that can lead to stroke. If you have the irregular heart rhythm of atrial fibrillation, you should be on a blood thinner unless there is a good reason you cannot take them. ? Understand all your medicine instructions.  Make sure that other conditions (such as anemia or atherosclerosis) are addressed.  Get help right away if:  You have sudden weakness or numbness of the face, arm, or leg, especially on one side of the body.  Your face or eyelid droops to one side.  You have sudden confusion.  You have trouble speaking (aphasia) or understanding.  You have sudden  trouble seeing in one or both eyes.  You have sudden trouble walking.  You have dizziness.  You have a loss of balance or coordination.  You have a sudden, severe headache with no known cause.  You have new chest pain or an irregular heartbeat. Any of these symptoms may represent a serious problem that is an emergency. Do not wait to see if the symptoms will go away. Get medical help at once. Call your local emergency services (911 in [place] Do not drive yourself to the hospital. This information is not intended to replace advice given to you by your health care provider. Make sure you discuss any questions you have with your health care provider. Document Released: 02/07/2004 Document Revised: 06/07/2015 Document Reviewed: 07/02/2012 Elsevier Interactive Patient Education  2017 [employer].

## 2016-06-19 DIAGNOSIS — I69398 Other sequelae of cerebral infarction: Secondary | ICD-10-CM | POA: Diagnosis not present

## 2016-06-19 DIAGNOSIS — R531 Weakness: Secondary | ICD-10-CM | POA: Diagnosis not present

## 2016-06-24 DIAGNOSIS — I69398 Other sequelae of cerebral infarction: Secondary | ICD-10-CM | POA: Diagnosis not present

## 2016-06-24 DIAGNOSIS — R531 Weakness: Secondary | ICD-10-CM | POA: Diagnosis not present

## 2016-08-20 ENCOUNTER — Ambulatory Visit: Payer: BLUE CROSS/BLUE SHIELD | Admitting: Podiatry

## 2016-08-25 ENCOUNTER — Encounter: Payer: Self-pay | Admitting: Podiatry

## 2016-08-25 ENCOUNTER — Ambulatory Visit (INDEPENDENT_AMBULATORY_CARE_PROVIDER_SITE_OTHER): Payer: Medicare Other | Admitting: Podiatry

## 2016-08-25 DIAGNOSIS — B351 Tinea unguium: Secondary | ICD-10-CM

## 2016-08-25 DIAGNOSIS — M79676 Pain in unspecified toe(s): Secondary | ICD-10-CM | POA: Diagnosis not present

## 2016-08-25 NOTE — Progress Notes (Signed)
   SUBJECTIVE Patient  presents to office today complaining of elongated, thickened nails. Pain while ambulating in shoes. Patient is unable to trim their own nails.   OBJECTIVE General Patient is awake, alert, and oriented x 3 and in no acute distress. Derm Skin is dry and supple bilateral. Negative open lesions or macerations. Remaining integument unremarkable. Nails are tender, long, thickened and dystrophic with subungual debris, consistent with onychomycosis, 1-5 bilateral. No signs of infection noted. Vasc  DP and PT pedal pulses palpable bilaterally. Temperature gradient within normal limits.  Neuro Epicritic and protective threshold sensation diminished bilaterally.  Musculoskeletal Exam No symptomatic pedal deformities noted bilateral. Muscular strength within normal limits.  ASSESSMENT 1. Onychodystrophic nails 1-5 bilateral with hyperkeratosis of nails.  2. Onychomycosis of nail due to dermatophyte bilateral 3. Pain in foot bilateral  PLAN OF CARE 1. Patient evaluated today.  2. Instructed to maintain good pedal hygiene and foot care.  3. Mechanical debridement of nails 1-5 bilaterally performed using a nail nipper. Filed with dremel without incident.  4. Return to clinic in 3 mos.    Zakira Ressel M. Tyrell Brereton, DPM [facility]  Dr. Alyra Patty M. Quianna Avery, DPM    [address]                                        [place], [place] [address]                Office [phone]  Fax [phone]      

## 2016-11-26 ENCOUNTER — Ambulatory Visit (INDEPENDENT_AMBULATORY_CARE_PROVIDER_SITE_OTHER): Payer: Medicare Other | Admitting: Podiatry

## 2016-11-26 ENCOUNTER — Encounter: Payer: Self-pay | Admitting: Podiatry

## 2016-11-26 DIAGNOSIS — M79676 Pain in unspecified toe(s): Secondary | ICD-10-CM | POA: Diagnosis not present

## 2016-11-26 DIAGNOSIS — B351 Tinea unguium: Secondary | ICD-10-CM

## 2016-11-30 NOTE — Progress Notes (Signed)
   SUBJECTIVE Patient presents to office today complaining of elongated, thickened nails. Pain while ambulating in shoes. Patient is unable to trim their own nails.   Past Medical History:  Diagnosis Date  . Hyperlipidemia   . Interstitial cystitis   . Scarlet fever 11/09/2007   Annotation: as a child, no sequellae Qualifier: History of  By: Debby BudNorins MD, Rosalyn GessMichael E   . Stroke (HCC)   . WHOOPING COUGH 11/10/2007   Qualifier: History of  By: Debby BudNorins MD, Rosalyn GessMichael E     OBJECTIVE General Patient is awake, alert, and oriented x 3 and in no acute distress. Derm Skin is dry and supple bilateral. Negative open lesions or macerations. Remaining integument unremarkable. Nails are tender, long, thickened and dystrophic with subungual debris, consistent with onychomycosis, 1-5 bilateral. No signs of infection noted. Vasc  DP and PT pedal pulses palpable bilaterally. Temperature gradient within normal limits.  Neuro Epicritic and protective threshold sensation diminished bilaterally.  Musculoskeletal Exam No symptomatic pedal deformities noted bilateral. Muscular strength within normal limits.  ASSESSMENT 1. Onychodystrophic nails 1-5 bilateral with hyperkeratosis of nails.  2. Onychomycosis of nail due to dermatophyte bilateral 3. Pain in foot bilateral  PLAN OF CARE 1. Patient evaluated today.  2. Instructed to maintain good pedal hygiene and foot care.  3. Mechanical debridement of nails 1-5 bilaterally performed using a nail nipper. Filed with dremel without incident.  4. Return to clinic in 3 mos.    Felecia ShellingBrent M. Gregori Abril, DPM [facility]  Dr. Felecia ShellingBrent M. Jammie Clink, DPM    [address]                                        [place], [place] [address]                Office [phone]  Fax [phone]

## 2016-12-16 ENCOUNTER — Encounter: Payer: Self-pay | Admitting: Neurology

## 2016-12-16 ENCOUNTER — Ambulatory Visit (INDEPENDENT_AMBULATORY_CARE_PROVIDER_SITE_OTHER): Payer: Medicare Other | Admitting: Neurology

## 2016-12-16 VITALS — BP 160/100 | HR 92 | Wt 201.0 lb

## 2016-12-16 DIAGNOSIS — I639 Cerebral infarction, unspecified: Secondary | ICD-10-CM

## 2016-12-16 DIAGNOSIS — I69359 Hemiplegia and hemiparesis following cerebral infarction affecting unspecified side: Secondary | ICD-10-CM

## 2016-12-16 NOTE — Progress Notes (Signed)
[facility] [address] [place]. [place] [address] [phone]       OFFICE FOLLOW-UP NOTE  Mr. Cody CoyerJohn E [name] Date of Birth:  03/15/1940 Medical Record Number:  [id number]   HPI: Initial visit 06/16/16 Cody [name] is a 76 year old Caucasian male seen today for first office follow-up visit following hospital admission for stroke in March 2018. He is accompanied by his wife. History is obtained from the patient, wife and review of Hospital medical records. I personally reviewed imaging films.He is 76 year old right-handed man who presented to the emergency department on 03/31/16  for evaluation of some speech changes. History is obtained directly from the patient  Who was a fair historian. His wife was present at the bedside and offered additional information is needed.It sounds as if the patient was in his usual state of health until he woke up this morning. His wife reports that he got up at about 6:00 03/31/16 morning to go to the bathroom and complained of some numbness in his right hand. He also noticed that he was having difficulty with his language. Specifically, he felt like he was in difficulty getting his words out and his wife reports that his language was nonsensical at times. This appears to have resolved and he is presently not endorsing any of these symptoms. It is unclear to me how long symptoms lasted as he is not able to tell me when exactly they resolved. He has never had any similar previous symptoms. He denies any vision changes, difficulty swallowing, slurred speech, new weakness, problems with coordination, or problems with balance.Last known well: 2300 03/30/16 NIHSS score: tPA given?: No, symptoms resolved, outside of window . MRI scan of the brain showed 2 tiny frontal parietal white matter lacunar infarcts. MRA showed no large vessel stenosis or occlusion. Carotid Doppler showed 1-39% right ICA and 40-59% left ICA stenosis. Transthoracic echo showed normal  ejection fraction. LDL cholesterol was 127 mg percent and hemoglobin A1c was 5.5. Patient was started on aspirin for stroke prevention and on Lipitor for elevated lipids. Patient's symptoms resolved completely by the time of discharge. He states is currently getting home physical therapy and use a walker for outdoor 7 long distances. He can walk without help in dose. He's had no more falls but prior to his TIA and had several falls. States his blood pressure is well controlled and today it is 137/77. He has not had an follow-up lipid profile checked but is tolerating his statin without myalgias or arthralgias. He does have an upcoming appointment with urology for interstitial cystitis of his bladder. He has no new neurological complaints.  Update 12/16/2016 ; He returns for follow-up after last visit to 6 months ago. He is accompanied by his wife. He continues to do well from stroke standpoint without recurrent stroke or TIA symptoms. His tolerate aspirin well without bleeding or bruising. He remains on Lipitor which is already well without muscle aches and pains. He cannot come even last lipid profile was checked by his primary physician. His blood pressure is states is usually better control at home in the 130 to 140 range but today it is elevated in office at 160/100. She continues to have mild gait and balance difficulties. He can walk indoors without help but uses a walker for outdoor speech. He's had no falls or injuries. He has no new complaints today. 14 system review of systems is positive for  gait and balance difficulties and no other complaints and all other systems negative  PMH:  Past Medical History:  Diagnosis Date  . Hyperlipidemia   . Interstitial cystitis   . Scarlet fever 11/09/2007   Annotation: as a child, no sequellae Qualifier: History of  By: Debby BudNorins MD, Rosalyn GessMichael E   . Stroke (HCC)   . WHOOPING COUGH 11/10/2007   Qualifier: History of  By: Debby BudNorins MD, Rosalyn GessMichael E     Social History:    Social History   Socioeconomic History  . Marital status: Married    Spouse name: Not on file  . Number of children: Not on file  . Years of education: Not on file  . Highest education level: Not on file  Social Needs  . Financial resource strain: Not on file  . Food insecurity - worry: Not on file  . Food insecurity - inability: Not on file  . Transportation needs - medical: Not on file  . Transportation needs - non-medical: Not on file  Occupational History  . Not on file  Tobacco Use  . Smoking status: Never Smoker  . Smokeless tobacco: Never Used  Substance and Sexual Activity  . Alcohol use: No  . Drug use: No  . Sexual activity: Not on file  Other Topics Concern  . Not on file  Social History Narrative  . Not on file    Medications:   Current Outpatient Medications on File Prior to Visit  Medication Sig Dispense Refill  . amLODipine (NORVASC) 2.5 MG tablet Take 1 tablet (2.5 mg total) by mouth daily. 90 tablet 3  . aspirin 325 MG EC tablet Take 1 tablet (325 mg total) by mouth daily. 90 tablet 3  . atorvastatin (LIPITOR) 80 MG tablet Take 1 tablet (80 mg total) by mouth daily at 6 PM. 90 tablet 3  . docusate sodium (COLACE) 100 MG capsule Take 100 mg by mouth every evening.    . hydrOXYzine (ATARAX/VISTARIL) 25 MG tablet Take 12.5 mg by mouth every other day.    . Multiple Vitamins-Minerals (ADULT ONE DAILY GUMMIES) CHEW Chew 2 each by mouth every evening.     . pentosan polysulfate (ELMIRON) 100 MG capsule Take 2 capsules (200 mg total) by mouth daily. (Patient taking differently: Take 100 mg by mouth every evening. )    . solifenacin (VESICARE) 10 MG tablet Take 10 mg by mouth every evening.    . tamsulosin (FLOMAX) 0.4 MG CAPS Take 1 capsule (0.4 mg total) by mouth daily. (Patient taking differently: Take 0.4 mg by mouth daily after supper. ) 30 capsule 3   No current facility-administered medications on file prior to visit.     Allergies:  No Known  Allergies  Physical Exam General: frail elderly Caucasian male, seated, in no evident distress Head: head normocephalic and atraumatic.  Neck: supple with soft left  carotid   bruit Cardiovascular: regular rate and rhythm, soft ejection murmur Musculoskeletal: no deformity Skin:  no rash/petichiae Vascular:  Normal pulses all extremities Vitals:   12/16/16 1424  BP: (!) 160/100  Pulse: 92   Neurologic Exam Mental Status: Awake and fully alert. Oriented to place and time. Recent and remote memory intact. Attention span, concentration and fund of knowledge appropriate. Mood and affect appropriate.  Cranial Nerves: Fundoscopic exam reveals sharp disc margins. Pupils equal, briskly reactive to light. Extraocular movements full without nystagmus. Visual fields full to confrontation. Hearing intact. Facial sensation intact. Face, tongue, palate moves normally and symmetrically.  Motor: Normal bulk and tone. Normal strength in all tested extremity muscles. Sensory.: intact to  touch ,pinprick .position and vibratory sensation.  Coordination: Rapid alternating movements normal in all extremities. Finger-to-nose and heel-to-shin performed accurately bilaterally. Gait and Station: Arises from chair without difficulty. Stance is stooped. Gait demonstrates wide bas and slght imbalance and uses a walker . Unble to heel, toe and tandem walk. Reflexes: 1+ and symmetric. Toes downgoing.      ASSESSMENT: 42 year Caucasian male with left parietal lacunar infarct secondary to small vessel disease in March 2018. Vascular risk factors of hypertension hyperlipidemia and age.    PLAN: I had a long d/w patient about his recent lacunar stroke, risk for recurrent stroke/TIAs, personally independently reviewed imaging studies and stroke evaluation results and answered questions.Continue aspirin 325 mg daily  for secondary stroke prevention and maintain strict control of hypertension with blood pressure goal  below 130/90, diabetes with hemoglobin A1c goal below 6.5% and lipids with LDL cholesterol goal below 70 mg/dL. I also advised the patient to eat a healthy diet with plenty of whole grains, cereals, fruits and vegetables, exercise regularly and maintain ideal body weight. We also talked about fall and safety precautions. And he was encouraged to use a walker while walking outdoors Followup in the future with me in  one year or call earlier if necessary. Greater than 50% of time during this 25 minute visit was spent on counseling,explanation of diagnosis, planning of further management, discussion with patient and family and coordination of care Delia Heady, MD  [facility] [address] [place], [place] [address]  Phone [phone] Fax [phone] Note: This document was prepared with digital dictation and possible smart phrase technology. Any transcriptional errors that result from this process are unintentional

## 2016-12-16 NOTE — Patient Instructions (Addendum)
I had a long d/w patient about his recent lacunar stroke, risk for recurrent stroke/TIAs, personally independently reviewed imaging studies and stroke evaluation results and answered questions.Continue aspirin 325 mg daily  for secondary stroke prevention and maintain strict control of hypertension with blood pressure goal below 130/90, diabetes with hemoglobin A1c goal below 6.5% and lipids with LDL cholesterol goal below 70 mg/dL. I also advised the patient to eat a healthy diet with plenty of whole grains, cereals, fruits and vegetables, exercise regularly and maintain ideal body weight. We also talked about fall and safety precautions. And he was encouraged to use a walker while walking outdoors Followup in the future with me in  one year or call earlier if necessary.   Fall Prevention in the Home Falls can cause injuries. They can happen to people of all ages. There are many things you can do to make your home safe and to help prevent falls. What can I do on the outside of my home?  Regularly fix the edges of walkways and driveways and fix any cracks.  Remove anything that might make you trip as you walk through a door, such as a raised step or threshold.  Trim any bushes or trees on the path to your home.  Use bright outdoor lighting.  Clear any walking paths of anything that might make someone trip, such as rocks or tools.  Regularly check to see if handrails are loose or broken. Make sure that both sides of any steps have handrails.  Any raised decks and porches should have guardrails on the edges.  Have any leaves, snow, or ice cleared regularly.  Use sand or salt on walking paths during winter.  Clean up any spills in your garage right away. This includes oil or grease spills. What can I do in the bathroom?  Use night lights.  Install grab bars by the toilet and in the tub and shower. Do not use towel bars as grab bars.  Use non-skid mats or decals in the tub or  shower.  If you need to sit down in the shower, use a plastic, non-slip stool.  Keep the floor dry. Clean up any water that spills on the floor as soon as it happens.  Remove soap buildup in the tub or shower regularly.  Attach bath mats securely with double-sided non-slip rug tape.  Do not have throw rugs and other things on the floor that can make you trip. What can I do in the bedroom?  Use night lights.  Make sure that you have a light by your bed that is easy to reach.  Do not use any sheets or blankets that are too big for your bed. They should not hang down onto the floor.  Have a firm chair that has side arms. You can use this for support while you get dressed.  Do not have throw rugs and other things on the floor that can make you trip. What can I do in the kitchen?  Clean up any spills right away.  Avoid walking on wet floors.  Keep items that you use a lot in easy-to-reach places.  If you need to reach something above you, use a strong step stool that has a grab bar.  Keep electrical cords out of the way.  Do not use floor polish or wax that makes floors slippery. If you must use wax, use non-skid floor wax.  Do not have throw rugs and other things on the floor that can  make you trip. What can I do with my stairs?  Do not leave any items on the stairs.  Make sure that there are handrails on both sides of the stairs and use them. Fix handrails that are broken or loose. Make sure that handrails are as long as the stairways.  Check any carpeting to make sure that it is firmly attached to the stairs. Fix any carpet that is loose or worn.  Avoid having throw rugs at the top or bottom of the stairs. If you do have throw rugs, attach them to the floor with carpet tape.  Make sure that you have a light switch at the top of the stairs and the bottom of the stairs. If you do not have them, ask someone to add them for you. What else can I do to help prevent  falls?  Wear shoes that: ? Do not have high heels. ? Have rubber bottoms. ? Are comfortable and fit you well. ? Are closed at the toe. Do not wear sandals.  If you use a stepladder: ? Make sure that it is fully opened. Do not climb a closed stepladder. ? Make sure that both sides of the stepladder are locked into place. ? Ask someone to hold it for you, if possible.  Clearly mark and make sure that you can see: ? Any grab bars or handrails. ? First and last steps. ? Where the edge of each step is.  Use tools that help you move around (mobility aids) if they are needed. These include: ? Canes. ? Walkers. ? Scooters. ? Crutches.  Turn on the lights when you go into a dark area. Replace any light bulbs as soon as they burn out.  Set up your furniture so you have a clear path. Avoid moving your furniture around.  If any of your floors are uneven, fix them.  If there are any pets around you, be aware of where they are.  Review your medicines with your doctor. Some medicines can make you feel dizzy. This can increase your chance of falling. Ask your doctor what other things that you can do to help prevent falls. This information is not intended to replace advice given to you by your health care provider. Make sure you discuss any questions you have with your health care provider. Document Released: 10/26/2008 Document Revised: 06/07/2015 Document Reviewed: 02/03/2014 Elsevier Interactive Patient Education  [employer].

## 2017-01-20 ENCOUNTER — Other Ambulatory Visit: Payer: Self-pay | Admitting: Internal Medicine

## 2017-02-17 ENCOUNTER — Other Ambulatory Visit: Payer: Self-pay | Admitting: Internal Medicine

## 2017-02-25 ENCOUNTER — Encounter (INDEPENDENT_AMBULATORY_CARE_PROVIDER_SITE_OTHER): Payer: Self-pay

## 2017-02-25 ENCOUNTER — Ambulatory Visit (INDEPENDENT_AMBULATORY_CARE_PROVIDER_SITE_OTHER): Payer: Medicare Other | Admitting: Podiatry

## 2017-02-25 DIAGNOSIS — M79676 Pain in unspecified toe(s): Secondary | ICD-10-CM | POA: Diagnosis not present

## 2017-02-25 DIAGNOSIS — B351 Tinea unguium: Secondary | ICD-10-CM | POA: Diagnosis not present

## 2017-03-01 NOTE — Progress Notes (Signed)
   SUBJECTIVE Patient presents to office today complaining of elongated, thickened nails. Pain while ambulating in shoes. Patient is unable to trim their own nails.   Past Medical History:  Diagnosis Date  . Hyperlipidemia   . Interstitial cystitis   . Scarlet fever 11/09/2007   Annotation: as a child, no sequellae Qualifier: History of  By: Norins MD, Michael E   . Stroke (HCC)   . WHOOPING COUGH 11/10/2007   Qualifier: History of  By: Norins MD, Michael E     OBJECTIVE General Patient is awake, alert, and oriented x 3 and in no acute distress. Derm Skin is dry and supple bilateral. Negative open lesions or macerations. Remaining integument unremarkable. Nails are tender, long, thickened and dystrophic with subungual debris, consistent with onychomycosis, 1-5 bilateral. No signs of infection noted. Vasc  DP and PT pedal pulses palpable bilaterally. Temperature gradient within normal limits.  Neuro Epicritic and protective threshold sensation diminished bilaterally.  Musculoskeletal Exam No symptomatic pedal deformities noted bilateral. Muscular strength within normal limits.  ASSESSMENT 1. Onychodystrophic nails 1-5 bilateral with hyperkeratosis of nails.  2. Onychomycosis of nail due to dermatophyte bilateral 3. Pain in foot bilateral  PLAN OF CARE 1. Patient evaluated today.  2. Instructed to maintain good pedal hygiene and foot care.  3. Mechanical debridement of nails 1-5 bilaterally performed using a nail nipper. Filed with dremel without incident.  4. Return to clinic in 3 mos.    Brent M. Evans, DPM [facility]  Dr. Brent M. Evans, DPM    [address]                                        [place], [place] [address]                Office [phone]  Fax [phone]     

## 2017-05-11 NOTE — Progress Notes (Signed)
Subjective:    Patient ID: Cody [name], male    DOB: Jun 29, 1940, 77 y.o.   MRN: [id number]  HPI The patient is here for follow up.  He eats laying down sometimes.  If he does follow-up while he is eating he often lays down just after eating.  He spends most of the day in the bed.  He does walk around during the day, but is very sedentary.  He has been experiencing daily burning in his chest and takes Tums daily which helps.  He does not have a healthy diet.  When he lays down he often feels like the wind gets caught off and he cannot talk.  He does lay down most of the day.  Sitting up relieves this problem.  He has some difficulty swallowing sometimes-typically when he tries to swallow too much food.  Congestion, allergies: He does have seasonal allergies.  Recently he has been coughing up some clear phlegm and feels this is from allergies.  He has occasional wheeze, but denies any shortness of breath.  He has had some scratchy throat, but denies nasal congestion.  He has been taking Claritin on occasion and he thinks that is helping.  There is been no fevers.  Hypertension: He is taking his medication daily. He is always compliant with a low sodium diet.  He denies chest pain, palpitations, edema, shortness of breath and regular headaches. He is not exercising regularly.  He does not monitor his blood pressure at home.    Hyperlipidemia: He is taking his medication daily. He is compliant with a low fat/cholesterol diet. He is not exercising regularly. He denies myalgias.   Hyperglycemia:  He is not compliant with a low sugar/carbohydrate diet.  He is not exercising regularly.  Interstitial cystitis:  He is seeing urology.  His bladder is better.    Generalized weakness, physical deconditioning: He uses a walker when he leaves his house, but in the house he does not need to use any device to ambulate.  He does walk around his house, but is not very active.  He spends most of the day  laying in bed.  H/o CVA - left parietal lacunar infarct to small vessel disease in March 2018. He is following with Dr Pearlean Brownie.  He is taking his statin, full aspirin and blood pressure medications as prescribed.  Medications and allergies reviewed with patient and updated if appropriate.  Patient Active Problem List   Diagnosis Date Noted  . GERD (gastroesophageal reflux disease) 05/12/2017  . Hypertension 04/19/2016  . Onychomycosis 04/19/2016  . History of stroke without residual deficits 03/31/2016  . Interstitial cystitis 03/31/2016  . Physical deconditioning 02/10/2015  . Hyperglycemia 02/09/2015  . Hyperlipidemia 04/09/2007  . HEMORRHOIDS 04/09/2007  . DIVERTICULOSIS, COLON 04/09/2007  . BPH (benign prostatic hyperplasia) 04/09/2007    Current Outpatient Medications on File Prior to Visit  Medication Sig Dispense Refill  . aspirin 325 MG EC tablet Take 1 tablet (325 mg total) by mouth daily. 90 tablet 3  . docusate sodium (COLACE) 100 MG capsule Take 100 mg by mouth every evening.    . hydrOXYzine (ATARAX/VISTARIL) 25 MG tablet Take 12.5 mg by mouth every other day.    . Multiple Vitamins-Minerals (ADULT ONE DAILY GUMMIES) CHEW Chew 2 each by mouth every evening.     . pentosan polysulfate (ELMIRON) 100 MG capsule Take 2 capsules (200 mg total) by mouth daily. (Patient taking differently: Take 100 mg by mouth every evening. )    .  solifenacin (VESICARE) 10 MG tablet Take 10 mg by mouth every evening.    . tamsulosin (FLOMAX) 0.4 MG CAPS Take 1 capsule (0.4 mg total) by mouth daily. (Patient taking differently: Take 0.4 mg by mouth daily after supper. ) 30 capsule 3   No current facility-administered medications on file prior to visit.     Past Medical History:  Diagnosis Date  . Hyperlipidemia   . Interstitial cystitis   . Scarlet fever 11/09/2007   Annotation: as a child, no sequellae Qualifier: History of  By: Debby Bud MD, Rosalyn Gess   . Stroke (HCC)   . WHOOPING COUGH  11/10/2007   Qualifier: History of  By: Debby Bud MD, Rosalyn Gess     Past Surgical History:  Procedure Laterality Date  . CATARACT EXTRACTION    . CYSTOSTOMY W/ BLADDER BIOPSY     2  . INGUINAL HERNIA REPAIR Bilateral   . KNEE ARTHROSCOPY Left   . TONSILLECTOMY  1948    Social History   Socioeconomic History  . Marital status: Married    Spouse name: Not on file  . Number of children: Not on file  . Years of education: Not on file  . Highest education level: Not on file  Occupational History  . Not on file  Social Needs  . Financial resource strain: Not on file  . Food insecurity:    Worry: Not on file    Inability: Not on file  . Transportation needs:    Medical: Not on file    Non-medical: Not on file  Tobacco Use  . Smoking status: Never Smoker  . Smokeless tobacco: Never Used  Substance and Sexual Activity  . Alcohol use: No  . Drug use: No  . Sexual activity: Not on file  Lifestyle  . Physical activity:    Days per week: Not on file    Minutes per session: Not on file  . Stress: Not on file  Relationships  . Social connections:    Talks on phone: Not on file    Gets together: Not on file    Attends religious service: Not on file    Active member of club or organization: Not on file    Attends meetings of clubs or organizations: Not on file    Relationship status: Not on file  Other Topics Concern  . Not on file  Social History Narrative  . Not on file    Family History  Problem Relation Age of Onset  . Colon cancer Mother   . Alcohol abuse Father   . Stroke Sister     Review of Systems  Constitutional: Negative for appetite change, chills and fever.  HENT: Positive for sore throat (with allergies - scratchy ) and trouble swallowing (sometimes). Negative for congestion.   Respiratory: Positive for cough (productive clear sputum) and wheezing (sometimes). Negative for shortness of breath.   Cardiovascular: Negative for chest pain, palpitations and  leg swelling.  Gastrointestinal: Negative for abdominal pain and nausea.       GERD daily  Neurological: Negative for light-headedness.       Objective:   Vitals:   05/12/17 1553  BP: 128/76  Pulse: 85  Resp: 16  SpO2: 95%   BP Readings from Last 3 Encounters:  05/12/17 128/76  12/16/16 (!) 160/100  06/16/16 137/77   Wt Readings from Last 3 Encounters:  05/12/17 199 lb (90.3 kg)  12/16/16 201 lb (91.2 kg)  06/16/16 193 lb 3.2 oz (87.6 kg)  Body mass index is 30.26 kg/m.   Physical Exam    Constitutional: Appears well-developed and well-nourished. No distress.  HENT:  Head: Normocephalic and atraumatic.  Neck: Neck supple. No tracheal deviation present. No thyromegaly present.  No cervical lymphadenopathy Cardiovascular: Normal rate, regular rhythm and normal heart sounds.   No murmur heard. No carotid bruit .  No edema Pulmonary/Chest: Effort normal and breath sounds normal. No respiratory distress. No has no wheezes. No rales. Abdomen: Soft, nontender, nondistended Skin: Skin is warm and dry. Not diaphoretic.  Psychiatric: Normal mood and affect. Behavior is normal.      Assessment & Plan:    See Problem List for Assessment and Plan of chronic medical problems.

## 2017-05-12 ENCOUNTER — Ambulatory Visit (INDEPENDENT_AMBULATORY_CARE_PROVIDER_SITE_OTHER): Payer: Medicare Other | Admitting: Internal Medicine

## 2017-05-12 ENCOUNTER — Encounter: Payer: Self-pay | Admitting: Internal Medicine

## 2017-05-12 ENCOUNTER — Other Ambulatory Visit (INDEPENDENT_AMBULATORY_CARE_PROVIDER_SITE_OTHER): Payer: Medicare Other

## 2017-05-12 VITALS — BP 128/76 | HR 85 | Resp 16 | Wt 199.0 lb

## 2017-05-12 DIAGNOSIS — Z8673 Personal history of transient ischemic attack (TIA), and cerebral infarction without residual deficits: Secondary | ICD-10-CM | POA: Diagnosis not present

## 2017-05-12 DIAGNOSIS — K219 Gastro-esophageal reflux disease without esophagitis: Secondary | ICD-10-CM | POA: Diagnosis not present

## 2017-05-12 DIAGNOSIS — Z23 Encounter for immunization: Secondary | ICD-10-CM

## 2017-05-12 DIAGNOSIS — I10 Essential (primary) hypertension: Secondary | ICD-10-CM | POA: Diagnosis not present

## 2017-05-12 DIAGNOSIS — E785 Hyperlipidemia, unspecified: Secondary | ICD-10-CM | POA: Diagnosis not present

## 2017-05-12 DIAGNOSIS — R739 Hyperglycemia, unspecified: Secondary | ICD-10-CM | POA: Diagnosis not present

## 2017-05-12 DIAGNOSIS — I6300 Cerebral infarction due to thrombosis of unspecified precerebral artery: Secondary | ICD-10-CM

## 2017-05-12 DIAGNOSIS — N3010 Interstitial cystitis (chronic) without hematuria: Secondary | ICD-10-CM

## 2017-05-12 LAB — CBC WITH DIFFERENTIAL/PLATELET
BASOS ABS: 0.1 10*3/uL (ref 0.0–0.1)
Basophils Relative: 1.2 % (ref 0.0–3.0)
Eosinophils Absolute: 0.7 10*3/uL (ref 0.0–0.7)
Eosinophils Relative: 8.5 % — ABNORMAL HIGH (ref 0.0–5.0)
HCT: 49.1 % (ref 39.0–52.0)
HEMOGLOBIN: 16.4 g/dL (ref 13.0–17.0)
LYMPHS ABS: 2 10*3/uL (ref 0.7–4.0)
Lymphocytes Relative: 23.2 % (ref 12.0–46.0)
MCHC: 33.5 g/dL (ref 30.0–36.0)
MCV: 88.8 fl (ref 78.0–100.0)
MONOS PCT: 10.2 % (ref 3.0–12.0)
Monocytes Absolute: 0.9 10*3/uL (ref 0.1–1.0)
NEUTROS PCT: 56.9 % (ref 43.0–77.0)
Neutro Abs: 4.9 10*3/uL (ref 1.4–7.7)
Platelets: 223 10*3/uL (ref 150.0–400.0)
RBC: 5.53 Mil/uL (ref 4.22–5.81)
RDW: 14.3 % (ref 11.5–15.5)
WBC: 8.6 10*3/uL (ref 4.0–10.5)

## 2017-05-12 LAB — COMPREHENSIVE METABOLIC PANEL
ALK PHOS: 91 U/L (ref 39–117)
ALT: 11 U/L (ref 0–53)
AST: 14 U/L (ref 0–37)
Albumin: 4 g/dL (ref 3.5–5.2)
BILIRUBIN TOTAL: 0.5 mg/dL (ref 0.2–1.2)
BUN: 10 mg/dL (ref 6–23)
CALCIUM: 8.7 mg/dL (ref 8.4–10.5)
CO2: 32 mEq/L (ref 19–32)
Chloride: 103 mEq/L (ref 96–112)
Creatinine, Ser: 0.92 mg/dL (ref 0.40–1.50)
GFR: 84.35 mL/min (ref >60.00)
Glucose, Bld: 84 mg/dL (ref 70–99)
POTASSIUM: 4.1 meq/L (ref 3.5–5.1)
Sodium: 142 mEq/L (ref 135–145)
TOTAL PROTEIN: 6.7 g/dL (ref 6.0–8.3)

## 2017-05-12 LAB — HEMOGLOBIN A1C: Hgb A1c MFr Bld: 6 % (ref 4.6–6.5)

## 2017-05-12 LAB — LIPID PANEL
CHOLESTEROL: 86 mg/dL (ref 0–200)
HDL: 36.1 mg/dL — AB (ref >39.00)
LDL CALC: 34 mg/dL (ref 0–99)
NonHDL: 49.91
Total CHOL/HDL Ratio: 2
Triglycerides: 79 mg/dL (ref 0.0–149.0)
VLDL: 15.8 mg/dL (ref 0.0–40.0)

## 2017-05-12 LAB — TSH: TSH: 3.42 u[IU]/mL (ref 0.35–4.50)

## 2017-05-12 MED ORDER — ATORVASTATIN CALCIUM 80 MG PO TABS: 80.0000 mg | ORAL_TABLET | Freq: Every day | ORAL | 3 refills | Status: AC

## 2017-05-12 MED ORDER — OMEPRAZOLE 20 MG PO CPDR: 20.0000 mg | DELAYED_RELEASE_CAPSULE | Freq: Every day | ORAL | 3 refills | Status: AC

## 2017-05-12 MED ORDER — AMLODIPINE BESYLATE 2.5 MG PO TABS: 2.5000 mg | ORAL_TABLET | Freq: Every day | ORAL | 3 refills | Status: AC

## 2017-05-12 NOTE — Assessment & Plan Note (Signed)
Following with urology

## 2017-05-12 NOTE — Assessment & Plan Note (Signed)
Check A1c Encouraged improving diet Encouraged increasing activity

## 2017-05-12 NOTE — Patient Instructions (Signed)
  Test(s) ordered today. Your results will be released to MyChart (or called to you) after review, usually within 72hours after test completion. If any changes need to be made, you will be notified at that same time.   prevnar pneumonia immunization administered today.   Medications reviewed and updated.  Changes include starting omeprazole for your heartburn.   Your prescription(s) have been submitted to your pharmacy. Please take as directed and contact our office if you believe you are having problem(s) with the medication(s).   Please followup in 6 months

## 2017-05-12 NOTE — Assessment & Plan Note (Signed)
BP well controlled Current regimen effective and well tolerated Continue current medications at current doses Cmp, cbc, tsh 

## 2017-05-12 NOTE — Assessment & Plan Note (Signed)
Check lipid panel, CMP, TSH Continue daily statin Regular exercise and healthy diet encouraged  

## 2017-05-12 NOTE — Assessment & Plan Note (Signed)
Following with neurology Continue full aspirin, statin Blood pressure well controlled Check blood work Follow-up in 6 months

## 2017-05-12 NOTE — Assessment & Plan Note (Addendum)
New gerd daily-taking Tums only Stressed not laying down when eating and not laying down within 3 hours of eating Start omeprazole 20 mg prior to breakfast-May need to increase dose if not controlled Discussed risks of uncontrolled GERD

## 2017-05-14 ENCOUNTER — Encounter: Payer: Self-pay | Admitting: Internal Medicine

## 2017-05-25 ENCOUNTER — Emergency Department (HOSPITAL_COMMUNITY)
Admission: EM | Admit: 2017-05-25 | Discharge: 2017-05-25 | Disposition: A | Discharge status: Home / Self Care | Payer: Medicare Other | Attending: Emergency Medicine | Admitting: Emergency Medicine

## 2017-05-25 ENCOUNTER — Encounter (HOSPITAL_COMMUNITY): Payer: Self-pay | Admitting: *Deleted

## 2017-05-25 DIAGNOSIS — Z79899 Other long term (current) drug therapy: Secondary | ICD-10-CM | POA: Insufficient documentation

## 2017-05-25 DIAGNOSIS — M2550 Pain in unspecified joint: Secondary | ICD-10-CM | POA: Diagnosis not present

## 2017-05-25 DIAGNOSIS — I10 Essential (primary) hypertension: Secondary | ICD-10-CM | POA: Diagnosis not present

## 2017-05-25 DIAGNOSIS — R531 Weakness: Secondary | ICD-10-CM

## 2017-05-25 DIAGNOSIS — Z7401 Bed confinement status: Secondary | ICD-10-CM | POA: Diagnosis not present

## 2017-05-25 DIAGNOSIS — M6281 Muscle weakness (generalized): Secondary | ICD-10-CM | POA: Diagnosis not present

## 2017-05-25 DIAGNOSIS — R030 Elevated blood-pressure reading, without diagnosis of hypertension: Secondary | ICD-10-CM | POA: Diagnosis not present

## 2017-05-25 LAB — URINALYSIS, ROUTINE W REFLEX MICROSCOPIC
Bacteria, UA: NONE SEEN
Bilirubin Urine: NEGATIVE
GLUCOSE, UA: NEGATIVE mg/dL
Ketones, ur: 5 mg/dL — AB
NITRITE: NEGATIVE
PH: 6 (ref 5.0–8.0)
Protein, ur: NEGATIVE mg/dL
SPECIFIC GRAVITY, URINE: 1.011 (ref 1.005–1.030)

## 2017-05-25 LAB — CBC WITH DIFFERENTIAL/PLATELET
BASOS ABS: 0 10*3/uL (ref 0.0–0.1)
Basophils Relative: 0 %
EOS ABS: 0.2 10*3/uL (ref 0.0–0.7)
Eosinophils Relative: 2 %
HCT: 51.1 % (ref 39.0–52.0)
Hemoglobin: 17.5 g/dL — ABNORMAL HIGH (ref 13.0–17.0)
Lymphocytes Relative: 18 %
Lymphs Abs: 1.9 10*3/uL (ref 0.7–4.0)
MCH: 30.2 pg (ref 26.0–34.0)
MCHC: 34.2 g/dL (ref 30.0–36.0)
MCV: 88.1 fL (ref 78.0–100.0)
MONO ABS: 1.3 10*3/uL — AB (ref 0.1–1.0)
MONOS PCT: 12 %
NEUTROS PCT: 68 %
Neutro Abs: 7.2 10*3/uL (ref 1.7–7.7)
Platelets: 207 10*3/uL (ref 150–400)
RBC: 5.8 MIL/uL (ref 4.22–5.81)
RDW: 13.8 % (ref 11.5–15.5)
WBC: 10.7 10*3/uL — ABNORMAL HIGH (ref 4.0–10.5)

## 2017-05-25 LAB — BASIC METABOLIC PANEL
ANION GAP: 12 (ref 5–15)
BUN: 12 mg/dL (ref 6–20)
CO2: 23 mmol/L (ref 22–32)
CREATININE: 0.87 mg/dL (ref 0.61–1.24)
Calcium: 8.7 mg/dL — ABNORMAL LOW (ref 8.9–10.3)
Chloride: 104 mmol/L (ref 101–111)
GLUCOSE: 86 mg/dL (ref 65–99)
Potassium: 3.5 mmol/L (ref 3.5–5.1)
SODIUM: 139 mmol/L (ref 135–145)

## 2017-05-25 NOTE — Progress Notes (Signed)
CSW met with patient and spouse via bedside to discuss SNF placement/ barriers to placement from the ED. Spouse stated patient has had an increased number of falls and is needing additional assistance while at home. CSW explained that patient would need to be admitted for 3 nights to use Medicare benefits however labs are still pending at this time. CSW explained that in the event patient was medical stable to discharge home from the ED home health could be arranged. Unsure of disposition at this time- labs still pending.   Kingsley Spittle, [facility] Emergency Room Clinical Social Worker [phone]

## 2017-05-25 NOTE — ED Notes (Signed)
Bed: ZO10 Expected date:  Expected time:  Means of arrival:  Comments: EMS recent falls

## 2017-05-25 NOTE — ED Triage Notes (Signed)
Per EMS, pt got out of bed and fell. Pt states his legs gave out. Pt denies pain at this time. [name] is having difficulty finding a rehab facility and would like help with placement. [name] is also concerned the pt may have a UTI.   BP 170/80 HR 80 RR 18 CBG 126

## 2017-05-25 NOTE — ED Provider Notes (Signed)
[facility]-EMERGENCY DEPT Provider Note   CSN: [id number] Arrival date & time: 05/25/17  1238     History   Chief Complaint Chief Complaint  Patient presents with  . Fall    HPI Cody [name] is a 77 y.o. male who presents with a fall. PMH significant for HTN, HLD, GERD, hx of CVA, insterstitial cystitis. He is accompanied by his wife and his daughter. His wife states that the patient is very inactive and spends most of his time in bed. He had a stroke last year and did have PT after that but since then he's so inactive that he's progressively become more weak. He has been using a walker to get around the house but over the past several days she has found him on the floor and has had to have people come over to get him up. Today he got out of bed and again he was on the floor and she had two other people come get him up. She decided to bring him to the hospital to be evaluated for a UTI because he has had urinary frequency and for possible rehab because she states she can't care for him because he's so weak. He has not had a fever, LOC, headache, neck pain, back pain, arthralgias, chest pain, SOB, abdominal pain, N/V/D.   HPI  Past Medical History:  Diagnosis Date  . Hyperlipidemia   . Interstitial cystitis   . Scarlet fever 11/09/2007   Annotation: as a child, no sequellae Qualifier: History of  By: Debby Bud MD, Rosalyn Gess   . Stroke (HCC)   . WHOOPING COUGH 11/10/2007   Qualifier: History of  By: Debby Bud MD, Rosalyn Gess     Patient Active Problem List   Diagnosis Date Noted  . GERD (gastroesophageal reflux disease) 05/12/2017  . Hypertension 04/19/2016  . Onychomycosis 04/19/2016  . History of stroke without residual deficits 03/31/2016  . Interstitial cystitis 03/31/2016  . Physical deconditioning 02/10/2015  . Prediabetes 02/09/2015  . Hyperlipidemia 04/09/2007  . HEMORRHOIDS 04/09/2007  . DIVERTICULOSIS, COLON 04/09/2007  . BPH (benign prostatic  hyperplasia) 04/09/2007    Past Surgical History:  Procedure Laterality Date  . CATARACT EXTRACTION    . CYSTOSTOMY W/ BLADDER BIOPSY     2  . INGUINAL HERNIA REPAIR Bilateral   . KNEE ARTHROSCOPY Left   . TONSILLECTOMY  1948        Home Medications    Prior to Admission medications   Medication Sig Start Date End Date Taking? Authorizing Provider  amLODipine (NORVASC) 2.5 MG tablet Take 1 tablet (2.5 mg total) by mouth daily. 05/12/17   Pincus Sanes, MD  aspirin 325 MG EC tablet Take 1 tablet (325 mg total) by mouth daily. 04/18/16   Pincus Sanes, MD  atorvastatin (LIPITOR) 80 MG tablet Take 1 tablet (80 mg total) by mouth daily at 6 PM. 05/12/17   Burns, Bobette Mo, MD  docusate sodium (COLACE) 100 MG capsule Take 100 mg by mouth every evening.    [provider]  hydrOXYzine (ATARAX/VISTARIL) 25 MG tablet Take 12.5 mg by mouth every other day.    [provider]  Multiple Vitamins-Minerals (ADULT ONE DAILY GUMMIES) CHEW Chew 2 each by mouth every evening.     [provider]  omeprazole (PRILOSEC) 20 MG capsule Take 1 capsule (20 mg total) by mouth daily before breakfast. 05/12/17   Burns, Bobette Mo, MD  pentosan polysulfate (ELMIRON) 100 MG capsule Take 2 capsules (  200 mg total) by mouth daily. Patient taking differently: Take 100 mg by mouth every evening.  08/12/12   Norins, Rosalyn Gess, MD  solifenacin (VESICARE) 10 MG tablet Take 10 mg by mouth every evening.    [provider]  tamsulosin (FLOMAX) 0.4 MG CAPS Take 1 capsule (0.4 mg total) by mouth daily. Patient taking differently: Take 0.4 mg by mouth daily after supper.  08/12/12   Norins, Rosalyn Gess, MD    Family History Family History  Problem Relation Age of Onset  . Colon cancer Mother   . Alcohol abuse Father   . Stroke Sister     Social History Social History   Tobacco Use  . Smoking status: Never Smoker  . Smokeless tobacco: Never Used  Substance Use Topics  . Alcohol use: No   . Drug use: No     Allergies   Patient has no known allergies.   Review of Systems Review of Systems  Constitutional: Negative for fever.  Respiratory: Negative for shortness of breath.   Cardiovascular: Negative for chest pain.  Gastrointestinal: Negative for abdominal pain, nausea and vomiting.  Genitourinary: Positive for frequency. Negative for dysuria.  Musculoskeletal: Negative for arthralgias, back pain, myalgias and neck pain.  Neurological: Negative for syncope and headaches.  Psychiatric/Behavioral: Negative for confusion.  All other systems reviewed and are negative.    Physical Exam Updated Vital Signs BP (!) 177/79 (BP Location: Right Arm)   Pulse 72   Temp 99.3 F (37.4 C) (Oral)   Resp (!) 24   SpO2 94%   Physical Exam  Constitutional: He is oriented to person, place, and time. He appears well-developed and well-nourished. No distress.  HENT:  Head: Normocephalic and atraumatic.  Eyes: Pupils are equal, round, and reactive to light. Conjunctivae are normal. Right eye exhibits no discharge. Left eye exhibits no discharge. No scleral icterus.  Neck: Normal range of motion.  Cardiovascular: Normal rate and regular rhythm.  Pulmonary/Chest: Effort normal and breath sounds normal. No respiratory distress.  Abdominal: Soft. Bowel sounds are normal. He exhibits no distension. There is no tenderness.  Musculoskeletal:  He is able to lift his legs off the bed but cannot hold them for more than 3 seconds  Neurological: He is alert and oriented to person, place, and time.  Skin: Skin is warm and dry.  Psychiatric: He has a normal mood and affect. His behavior is normal.  Nursing note and vitals reviewed.    ED Treatments / Results  Labs (all labs ordered are listed, but only abnormal results are displayed) Labs Reviewed  BASIC METABOLIC PANEL - Abnormal; Notable for the following components:      Result Value   Calcium 8.7 (*)    All other components  within normal limits  CBC WITH DIFFERENTIAL/PLATELET - Abnormal; Notable for the following components:   WBC 10.7 (*)    Hemoglobin 17.5 (*)    Monocytes Absolute 1.3 (*)    All other components within normal limits  URINALYSIS, ROUTINE W REFLEX MICROSCOPIC - Abnormal; Notable for the following components:   Hgb urine dipstick MODERATE (*)    Ketones, ur 5 (*)    Leukocytes, UA TRACE (*)    All other components within normal limits  URINE CULTURE    EKG None  Radiology No results found.  Procedures Procedures (including critical care time)  Medications Ordered in ED Medications - No data to display   Initial Impression / Assessment and Plan / ED Course  I have reviewed the triage vital signs and the nursing notes.  Pertinent labs & imaging results that were available during my care of the patient were reviewed by me and considered in my medical decision making (see chart for details).  77 year old male presents with gradually worsening weakness and frequent falls. He is hypertensive but otherwise vitals are normal. He has no trauma or physical complaints on exam. It seems the main reason they are here is for placement in a rehab facility. I discussed with the patient's wife and daughter that we do not place patients from the ED - only if they are admitted to the hospital which in this case I do not think he would qualify. His labs are overall unremarkable other than possible mild hemoconcentration. UA is remarkable for moderate hgb, 5 ketones, trace leukocytes, 11-20 WBC. Culture was sent. Discussed results with the patient and his wife. CM and SW were consulted. Will d/c with home health.  Final Clinical Impressions(s) / ED Diagnoses   Final diagnoses:  Generalized weakness    ED Discharge Orders    None       Bethel Born, PA-C 05/25/17 1527    Bethann Berkshire, MD 05/26/17 1517

## 2017-05-25 NOTE — Discharge Instructions (Signed)
Please follow up with your doctor.

## 2017-05-25 NOTE — Care Management Note (Addendum)
Case Management Note  Patient Details  Name: Cody [name] MRN: [id number] Date of Birth: [birth year]  CM consulted for [facility] services vs. SNF rehab.  CM noted pt has Medicare and no 3 night inpt qualifying stay in the last 30 days.  CM contacted Denyse Amass with [facility] for [facility] First Program review.  Pt qualifies for program.  CM spoke with pt and spouse at bedside to discussed [facility] agency choice.  Pt has previously used [place] [facility] and would like to use them again.  CM then discussed the Home First Program and also private pay for SNF or ALF.  CM updated Denyse Amass and Clar[place], [place].  No further CM needs noted at this time.  Expected Discharge Date:   05/25/2017               Expected Discharge Plan:  Home w Home Health Services  Discharge planning Services  CM Consult  Post Acute Care Choice:  Home Health Choice offered to:  Patient, Spouse, Adult Children  HH Arranged:  RN, PT, OT, NA, SW HH Agency:  [facility]  Status of Service:  Completed, signed off  Flo Berroa, Lynnae Sandhoff, RN 05/25/2017, 1:47 PM

## 2017-05-26 ENCOUNTER — Other Ambulatory Visit: Payer: Self-pay | Admitting: *Deleted

## 2017-05-26 DIAGNOSIS — I69398 Other sequelae of cerebral infarction: Secondary | ICD-10-CM | POA: Diagnosis not present

## 2017-05-26 DIAGNOSIS — M6281 Muscle weakness (generalized): Secondary | ICD-10-CM | POA: Diagnosis not present

## 2017-05-26 LAB — URINE CULTURE

## 2017-05-26 NOTE — Consult Note (Signed)
Made aware of patient's enrollment of [facility] First program by [facility] with [facility] First. Patient discharged from [facility] ED on 05/25/17.  Telephone call made to speak with Cody [name] about potential [facility] Care Management assistance for pharmacy of transportation needs. Cody [name] denies having concerns with either.  Also discussed that [facility] Care Management could potentially follow him for community case management needs post [facility] First. Cody [name] expresses understanding.   Will send notification to [facility] Care Management office of [facility] First enrollment.   Cody Noble, MSN-Ed, RN,BSN [facility] Liaison [phone]

## 2017-05-27 ENCOUNTER — Ambulatory Visit: Payer: BLUE CROSS/BLUE SHIELD | Admitting: Podiatry

## 2017-05-28 DIAGNOSIS — M6281 Muscle weakness (generalized): Secondary | ICD-10-CM | POA: Diagnosis not present

## 2017-05-28 DIAGNOSIS — I69398 Other sequelae of cerebral infarction: Secondary | ICD-10-CM | POA: Diagnosis not present

## 2017-05-29 ENCOUNTER — Telehealth: Payer: Self-pay | Admitting: Internal Medicine

## 2017-05-29 DIAGNOSIS — I69398 Other sequelae of cerebral infarction: Secondary | ICD-10-CM | POA: Diagnosis not present

## 2017-05-29 DIAGNOSIS — M6281 Muscle weakness (generalized): Secondary | ICD-10-CM | POA: Diagnosis not present

## 2017-05-29 NOTE — Telephone Encounter (Signed)
Copied from [place] [id number]. Topic: Quick Communication - See Telephone Encounter >> May 29, 2017 11:47 AM Ether Griffins B wrote: CRM for notification. See Telephone encounter for: 05/29/17.+  Timmothy Sours OT with [facility] needing verbal orders for 1 x 4 ADL, transfers, exercise and DC goals met or maximum potential. CB# [phone]

## 2017-06-01 DIAGNOSIS — I69398 Other sequelae of cerebral infarction: Secondary | ICD-10-CM | POA: Diagnosis not present

## 2017-06-01 DIAGNOSIS — M6281 Muscle weakness (generalized): Secondary | ICD-10-CM | POA: Diagnosis not present

## 2017-06-01 NOTE — Telephone Encounter (Signed)
LVM giving verbal orders for OT per MD.

## 2017-06-02 DIAGNOSIS — I69398 Other sequelae of cerebral infarction: Secondary | ICD-10-CM | POA: Diagnosis not present

## 2017-06-02 DIAGNOSIS — M6281 Muscle weakness (generalized): Secondary | ICD-10-CM | POA: Diagnosis not present

## 2017-06-04 DIAGNOSIS — I69398 Other sequelae of cerebral infarction: Secondary | ICD-10-CM | POA: Diagnosis not present

## 2017-06-04 DIAGNOSIS — M6281 Muscle weakness (generalized): Secondary | ICD-10-CM | POA: Diagnosis not present

## 2017-06-05 DIAGNOSIS — I69398 Other sequelae of cerebral infarction: Secondary | ICD-10-CM | POA: Diagnosis not present

## 2017-06-05 DIAGNOSIS — M6281 Muscle weakness (generalized): Secondary | ICD-10-CM | POA: Diagnosis not present

## 2017-06-08 ENCOUNTER — Emergency Department (HOSPITAL_COMMUNITY)
Admission: EM | Admit: 2017-06-08 | Discharge: 2017-06-08 | Disposition: A | Discharge status: Home / Self Care | Payer: Medicare Other | Attending: Emergency Medicine | Admitting: Emergency Medicine

## 2017-06-08 ENCOUNTER — Emergency Department (HOSPITAL_COMMUNITY): Payer: Medicare Other

## 2017-06-08 DIAGNOSIS — R296 Repeated falls: Secondary | ICD-10-CM | POA: Insufficient documentation

## 2017-06-08 DIAGNOSIS — Z8673 Personal history of transient ischemic attack (TIA), and cerebral infarction without residual deficits: Secondary | ICD-10-CM | POA: Diagnosis not present

## 2017-06-08 DIAGNOSIS — Z9181 History of falling: Secondary | ICD-10-CM | POA: Diagnosis not present

## 2017-06-08 DIAGNOSIS — I10 Essential (primary) hypertension: Secondary | ICD-10-CM | POA: Diagnosis not present

## 2017-06-08 DIAGNOSIS — Z7982 Long term (current) use of aspirin: Secondary | ICD-10-CM | POA: Insufficient documentation

## 2017-06-08 DIAGNOSIS — Z7401 Bed confinement status: Secondary | ICD-10-CM | POA: Diagnosis not present

## 2017-06-08 DIAGNOSIS — M50222 Other cervical disc displacement at C5-C6 level: Secondary | ICD-10-CM | POA: Diagnosis not present

## 2017-06-08 DIAGNOSIS — I69398 Other sequelae of cerebral infarction: Secondary | ICD-10-CM | POA: Diagnosis not present

## 2017-06-08 DIAGNOSIS — Z79899 Other long term (current) drug therapy: Secondary | ICD-10-CM | POA: Diagnosis not present

## 2017-06-08 DIAGNOSIS — M2550 Pain in unspecified joint: Secondary | ICD-10-CM | POA: Diagnosis not present

## 2017-06-08 DIAGNOSIS — R29898 Other symptoms and signs involving the musculoskeletal system: Secondary | ICD-10-CM

## 2017-06-08 DIAGNOSIS — R404 Transient alteration of awareness: Secondary | ICD-10-CM | POA: Diagnosis not present

## 2017-06-08 DIAGNOSIS — R531 Weakness: Secondary | ICD-10-CM | POA: Diagnosis not present

## 2017-06-08 DIAGNOSIS — M6281 Muscle weakness (generalized): Secondary | ICD-10-CM | POA: Diagnosis not present

## 2017-06-08 DIAGNOSIS — M5124 Other intervertebral disc displacement, thoracic region: Secondary | ICD-10-CM | POA: Diagnosis not present

## 2017-06-08 DIAGNOSIS — S0990XA Unspecified injury of head, initial encounter: Secondary | ICD-10-CM | POA: Diagnosis not present

## 2017-06-08 LAB — BASIC METABOLIC PANEL
Anion gap: 11 (ref 5–15)
BUN: 10 mg/dL (ref 6–20)
CO2: 25 mmol/L (ref 22–32)
Calcium: 8.8 mg/dL — ABNORMAL LOW (ref 8.9–10.3)
Chloride: 104 mmol/L (ref 101–111)
Creatinine, Ser: 0.81 mg/dL (ref 0.61–1.24)
GFR calc Af Amer: >60 mL/min (ref >60)
GFR calc non Af Amer: >60 mL/min (ref >60)
GLUCOSE: 103 mg/dL — AB (ref 65–99)
POTASSIUM: 3.6 mmol/L (ref 3.5–5.1)
Sodium: 140 mmol/L (ref 135–145)

## 2017-06-08 LAB — CBC
HEMATOCRIT: 48.3 % (ref 39.0–52.0)
Hemoglobin: 16.5 g/dL (ref 13.0–17.0)
MCH: 30.3 pg (ref 26.0–34.0)
MCHC: 34.2 g/dL (ref 30.0–36.0)
MCV: 88.6 fL (ref 78.0–100.0)
Platelets: 224 10*3/uL (ref 150–400)
RBC: 5.45 MIL/uL (ref 4.22–5.81)
RDW: 13.9 % (ref 11.5–15.5)
WBC: 9.4 10*3/uL (ref 4.0–10.5)

## 2017-06-08 LAB — URINALYSIS, ROUTINE W REFLEX MICROSCOPIC
Bacteria, UA: NONE SEEN
Bilirubin Urine: NEGATIVE
GLUCOSE, UA: NEGATIVE mg/dL
Hgb urine dipstick: NEGATIVE
Ketones, ur: 5 mg/dL — AB
Nitrite: NEGATIVE
PH: 6 (ref 5.0–8.0)
Protein, ur: NEGATIVE mg/dL
SPECIFIC GRAVITY, URINE: 1.014 (ref 1.005–1.030)

## 2017-06-08 LAB — HEPATIC FUNCTION PANEL
ALBUMIN: 3.8 g/dL (ref 3.5–5.0)
ALT: 17 U/L (ref 17–63)
AST: 22 U/L (ref 15–41)
Alkaline Phosphatase: 83 U/L (ref 38–126)
BILIRUBIN TOTAL: 0.7 mg/dL (ref 0.3–1.2)
Bilirubin, Direct: 0.2 mg/dL (ref 0.1–0.5)
Indirect Bilirubin: 0.5 mg/dL (ref 0.3–0.9)
Total Protein: 6.9 g/dL (ref 6.5–8.1)

## 2017-06-08 LAB — TROPONIN I: Troponin I: <0.03 ng/mL (ref <0.03)

## 2017-06-08 LAB — CBG MONITORING, ED: GLUCOSE-CAPILLARY: 97 mg/dL (ref 65–99)

## 2017-06-08 MED ORDER — LORAZEPAM 2 MG/ML IJ SOLN
1.0000 mg | Freq: Once | INTRAMUSCULAR | Status: AC
  Administered 2017-06-08: 1 mg via INTRAVENOUS
  Filled 2017-06-08: qty 1

## 2017-06-08 NOTE — ED Notes (Signed)
Bed: WA01 Expected date:  Expected time:  Means of arrival:  Comments: 

## 2017-06-08 NOTE — ED Notes (Signed)
[facility] called for transportation  

## 2017-06-08 NOTE — ED Notes (Signed)
Pt transported to MRI 

## 2017-06-08 NOTE — ED Provider Notes (Signed)
[name] Provider Note   CSN: [id number] Arrival date & time: 06/08/17  1135     History   Chief Complaint Chief Complaint  Patient presents with  . Extremity Weakness    bilateral leg    HPI [name] is a 77 y.o. male.  The history is provided by the patient and the spouse. No language interpreter was used.  Extremity Weakness    Cody [name] is a 77 y.o. male who presents to the Emergency Department complaining of weakness. He resents to the emergency department accompanied by his wife for evaluation of generalized weakness, greatest and bilateral lower extremities that is been ongoing for the last several weeks. Symptoms have been slowly progressing and he has been less ambulatory over the last few weeks. Over the last two days he has been falling, no injuries from the falls.  He has chronic dysuria - ongoing issue.  No fevers, chest pain, sob, n/v/d.  He has PT at home twice weekly.  PT was there today and it took a two persons to get him to the bathroom.  Past Medical History:  Diagnosis Date  . Hyperlipidemia   . Interstitial cystitis   . Scarlet fever 11/09/2007   Annotation: as a child, no sequellae Qualifier: History of  By: Debby Bud MD, Rosalyn Gess   . Stroke (HCC)   . WHOOPING COUGH 11/10/2007   Qualifier: History of  By: Debby Bud MD, Rosalyn Gess     Patient Active Problem List   Diagnosis Date Noted  . GERD (gastroesophageal reflux disease) 05/12/2017  . Hypertension 04/19/2016  . Onychomycosis 04/19/2016  . History of stroke without residual deficits 03/31/2016  . Interstitial cystitis 03/31/2016  . Physical deconditioning 02/10/2015  . Prediabetes 02/09/2015  . Hyperlipidemia 04/09/2007  . HEMORRHOIDS 04/09/2007  . DIVERTICULOSIS, COLON 04/09/2007  . BPH (benign prostatic hyperplasia) 04/09/2007    Past Surgical History:  Procedure Laterality Date  . CATARACT EXTRACTION    . CYSTOSTOMY W/ BLADDER BIOPSY     2    . INGUINAL HERNIA REPAIR Bilateral   . KNEE ARTHROSCOPY Left   . TONSILLECTOMY  1948        Home Medications    Prior to Admission medications   Medication Sig Start Date End Date Taking? Authorizing Provider  amLODipine (NORVASC) 2.5 MG tablet Take 1 tablet (2.5 mg total) by mouth daily. 05/12/17  Yes Burns, Bobette Mo, MD  aspirin 325 MG EC tablet Take 1 tablet (325 mg total) by mouth daily. 04/18/16  Yes Burns, Bobette Mo, MD  atorvastatin (LIPITOR) 80 MG tablet Take 1 tablet (80 mg total) by mouth daily at 6 PM. 05/12/17  Yes Burns, Bobette Mo, MD  docusate sodium (COLACE) 100 MG capsule Take 100 mg by mouth every evening.   Yes [provider]  hydrOXYzine (ATARAX/VISTARIL) 25 MG tablet Take 12.5 mg by mouth every other day.   Yes [provider]  loratadine (CLARITIN) 10 MG tablet Take 10 mg by mouth daily.   Yes [provider]  Multiple Vitamins-Minerals (ADULT ONE DAILY GUMMIES) CHEW Chew 2 each by mouth every evening.    Yes [provider]  omeprazole (PRILOSEC) 20 MG capsule Take 1 capsule (20 mg total) by mouth daily before breakfast. 05/12/17  Yes Burns, Bobette Mo, MD  pentosan polysulfate (ELMIRON) 100 MG capsule Take 2 capsules (200 mg total) by mouth daily. Patient taking differently: Take 100 mg by mouth every evening.  08/12/12  Yes  Norins, Rosalyn Gess, MD  solifenacin (VESICARE) 10 MG tablet Take 10 mg by mouth every evening.   Yes [provider]  tamsulosin (FLOMAX) 0.4 MG CAPS Take 1 capsule (0.4 mg total) by mouth daily. Patient taking differently: Take 0.4 mg by mouth daily after supper.  08/12/12  Yes Norins, Rosalyn Gess, MD    Family History Family History  Problem Relation Age of Onset  . Colon cancer Mother   . Alcohol abuse Father   . Stroke Sister     Social History Social History   Tobacco Use  . Smoking status: Never Smoker  . Smokeless tobacco: Never Used  Substance Use Topics  . Alcohol use: No  . Drug use: No      Allergies   Patient has no known allergies.   Review of Systems Review of Systems  Musculoskeletal: Positive for extremity weakness.  All other systems reviewed and are negative.    Physical Exam Updated Vital Signs BP (!) 151/78   Pulse 74   Temp 98.4 F (36.9 C) (Oral)   Resp (!) 22   SpO2 95%   Physical Exam  Constitutional: He is oriented to person, place, and time. He appears well-developed and well-nourished.  HENT:  Head: Normocephalic and atraumatic.  Cardiovascular: Normal rate and regular rhythm.  No murmur heard. Pulmonary/Chest: Effort normal and breath sounds normal. No respiratory distress.  Abdominal: Soft. There is no tenderness. There is no rebound and no guarding.  Musculoskeletal: He exhibits no edema or tenderness.  Neurological: He is alert and oriented to person, place, and time.  5/5 strength in BUE.  4/5 strength in BLE.  Sensation to light touch intact in all four extremities.    Skin: Skin is warm and dry.  Psychiatric: He has a normal mood and affect. His behavior is normal.  Nursing note and vitals reviewed.    ED Treatments / Results  Labs (all labs ordered are listed, but only abnormal results are displayed) Labs Reviewed  BASIC METABOLIC PANEL - Abnormal; Notable for the following components:      Result Value   Glucose, Bld 103 (*)    Calcium 8.8 (*)    All other components within normal limits  URINALYSIS, ROUTINE W REFLEX MICROSCOPIC - Abnormal; Notable for the following components:   Ketones, ur 5 (*)    Leukocytes, UA TRACE (*)    All other components within normal limits  CBC  TROPONIN I  HEPATIC FUNCTION PANEL  CBG MONITORING, ED    EKG EKG Interpretation  Date/Time:  Monday Jun 08 2017 12:44:51 EDT Ventricular Rate:  77 PR Interval:    QRS Duration: 90 QT Interval:  376 QTC Calculation: 426 R Axis:   -47 Text Interpretation:  Sinus rhythm Inferior infarct, old Baseline wander in lead(s) V1 Confirmed by  Tilden Fossa [phone]) on 06/08/2017 2:52:50 PM   Radiology Dg Chest 2 View  Result Date: 06/08/2017 CLINICAL DATA:  Multiple falls.  Weakness.  History of asthma. EXAM: CHEST - 2 VIEW COMPARISON:  03/31/2016 FINDINGS: Moderate right hemidiaphragm elevation. Midline trachea. Normal heart size and mediastinal contours. No pleural effusion or pneumothorax. Right infrahilar and medial lower lobe volume loss with atelectasis or scar. IMPRESSION: No acute cardiopulmonary disease. Chronic right hemidiaphragm elevation with adjacent right lung base volume loss. Electronically Signed   By: Jeronimo Greaves M.D.   On: 06/08/2017 13:55   Ct Head Wo Contrast  Result Date: 06/08/2017 CLINICAL DATA:  Lower extremity weakness.  Fall 1  day prior EXAM: CT HEAD WITHOUT CONTRAST TECHNIQUE: Contiguous axial images were obtained from the base of the skull through the vertex without intravenous contrast. COMPARISON:  Head CT March 31, 2016 brain MRI March 31, 2016 FINDINGS: Brain: Mild diffuse atrophy is stable. There is no intracranial mass, hemorrhage, extra-axial fluid collection, or midline shift. There is extensive small vessel disease throughout the centra semiovale bilaterally. Small vessel disease is noted throughout internal capsules bilaterally as well as in the left external capsule and portions of the anterior left lentiform nucleus. There is no new gray-white compartment lesion is demonstrable. No acute infarct evident. Vascular: No hyperdense vessels. There is calcification in each carotid siphon region. Skull: The bony calvarium appears intact. Sinuses/Orbits: There is mucosal thickening in multiple ethmoid air cells. Other visualized paranasal sinuses are clear. Orbits appear symmetric bilaterally. Other: Mastoid air cells are clear. There is debris in each external auditory canal. IMPRESSION: Atrophy with extensive supratentorial small vessel disease, stable. No acute infarct evident. No mass or hemorrhage. There  are foci of vascular calcification. There is mucosal thickening in multiple ethmoid air cells. There is probable cerumen in each external auditory canal. Electronically Signed   By: Bretta Bang III M.D.   On: 06/08/2017 15:16    Procedures Procedures (including critical care time)  Medications Ordered in ED Medications - No data to display   Initial Impression / Assessment and Plan / ED Course  I have reviewed the triage vital signs and the nursing notes.  Pertinent labs & imaging results that were available during my care of the patient were reviewed by me and considered in my medical decision making (see chart for details).     Patient here for evaluation of progressive bilateral lower extremity weakness over the last several weeks, now falling yesterday at home. No injuries related to the fall yesterday. He does have lower extremity weakness bilaterally on examination. No reports of neck or back pain. Presentation is not consistent with recurrent CVA, sepsis, UTI. Plan to MRI C and T spine to evaluate for cord compression. Patient care transferred pending MRI results.  Final Clinical Impressions(s) / ED Diagnoses   Final diagnoses:  None    ED Discharge Orders    None       Tilden Fossa, MD 06/08/17 1728

## 2017-06-08 NOTE — ED Provider Notes (Signed)
4:42 PM Care assumed from Dr. Madilyn Hook.  At time of transfer care, patient is awaiting MRI of the C and T-spine.  Plan of care is that the patient will be discharged home if the MRIs do not show any acute findings requiring intervention or admission.  Patient and family understood this plan prior to transfer care, awaiting results of MRIs.  MRI is reassuring with no cord injury seen.  Patient informed of these findings and he will be discharged home to continue with outpatient physical therapy and PCP management.  Patient and family understood plan of care and patient was discharged in good condition.   Clinical Impression: 1. Bilateral leg weakness     Disposition: Discharge  Condition: Good  I have discussed the results, Dx and Tx plan with the pt(& family if present). He/she/they expressed understanding and agree(s) with the plan. Discharge instructions discussed at great length. Strict return precautions discussed and pt &/or family have verbalized understanding of the instructions. No further questions at time of discharge.    New Prescriptions   No medications on file    Follow Up: Pincus Sanes, MD [address] [place] [address] [phone]     [facility] [address] [id number] mc [address] [place] [address] [phone]       Jakell Trusty, Canary Brim, MD 06/09/17 [phone]

## 2017-06-08 NOTE — ED Triage Notes (Addendum)
Per EMS, pt from home complains of bilateral leg weakness. Pt's physical therapist states the pt is too weak to move properly. Pt was able to stand with assistance but unable to use his walker as usual. Pt fell yesterday, was seen by EMS but not transported. Pt denies fall since yesterday. Pt has hx of leg weakness since stroke in March 2018 but states symptoms are worse for past couple days. Pt A&Ox4.

## 2017-06-08 NOTE — Progress Notes (Addendum)
Consult request has been received. CSW attempting to follow up at present time.  CSW consulted with EDP who states family is aware pt needs a 3-day inpatient hospitalization for Medicare to pay for SNF stay and CSW reviewed chart and notes pt/spouse were told this by the 1st shift [facility] ED CSW.  Family made aware by EDP, per EPD, that pt does not meet criteria for inpatient hospitalization.  EPD states family has HH services already in place.  Please reconsult if future social work needs arise.  CSW signing off, as social work intervention is no longer needed.  Dorothe Pea. Korynne Dols, LCSW, LCAS, CSI Clinical Social Worker Ph: [phone]

## 2017-06-08 NOTE — Discharge Instructions (Signed)
Your MRI today did not show acute traumatic injuries or significant cord injury.  Please follow-up with your primary care physician for reassessment and further management.  If any symptoms change or worsen, please return to the nearest emergency department.

## 2017-06-08 NOTE — ED Notes (Signed)
MRI called this Clinical research associate. States patient is "severely claustrophobic and needs medication." EDMD Tegler notified. See orders.

## 2017-06-09 ENCOUNTER — Other Ambulatory Visit: Payer: Self-pay

## 2017-06-09 DIAGNOSIS — Z9181 History of falling: Secondary | ICD-10-CM | POA: Diagnosis not present

## 2017-06-09 DIAGNOSIS — K219 Gastro-esophageal reflux disease without esophagitis: Secondary | ICD-10-CM | POA: Diagnosis not present

## 2017-06-09 DIAGNOSIS — Z993 Dependence on wheelchair: Secondary | ICD-10-CM | POA: Diagnosis not present

## 2017-06-09 DIAGNOSIS — Z7982 Long term (current) use of aspirin: Secondary | ICD-10-CM | POA: Diagnosis not present

## 2017-06-09 DIAGNOSIS — I10 Essential (primary) hypertension: Secondary | ICD-10-CM | POA: Diagnosis not present

## 2017-06-09 DIAGNOSIS — M6281 Muscle weakness (generalized): Secondary | ICD-10-CM | POA: Diagnosis not present

## 2017-06-09 DIAGNOSIS — E785 Hyperlipidemia, unspecified: Secondary | ICD-10-CM | POA: Diagnosis not present

## 2017-06-09 DIAGNOSIS — I69398 Other sequelae of cerebral infarction: Secondary | ICD-10-CM | POA: Diagnosis not present

## 2017-06-09 NOTE — Patient Outreach (Signed)
[facility]) Care Management  06/09/2017  [name] 1940/05/02 [id number]   Referral Date: 06/09/17 Referral Source: Self Referral Referral Reason: Patient needing placement for rehab   Outreach Attempt #1 Spoke with wife who is able to verify HIPAA. She reports that patient has had multiple falls and has gone to the ER for falls.  She states that patient needs some inpatient rehab.  Patient currently has [facility] in for OT, PT and Aide. However, wife reports that it is not enough and patient needs some inpatient rehab.  She states they have money to pay privately for about a month.     Social: Patient lives in the home with his spouse who provides care for all patient needs.    Conditions: Patient had a stroke last year and patient also has history of Pre-diabetes and Hypertension per patient's wife.  She reports his biggest problem is weakness, which has caused recent falls. She states that EMS has been to the home 3 times to assist to get patient off the floor.    Medications: Patient takes medications as prescribed.  Wife offers no questions or problems with medications.   Appointments:  Patient saw PCP about 3 weeks ago per spouse.     Advanced Directives: Patient has a HC POA but no living will.   Consent: Discussed [facility] Services. Wife agreeable for social work referral to help with placement.    Plan: RN CM will refer to social work to help with placement.   RN CM will sign off at this time.    [name], RN, MSN [facility] Care Management Care Management Coordinator Direct Line [phone] Toll Free: [phone]  Fax: [phone]

## 2017-06-09 NOTE — Telephone Encounter (Signed)
This encounter was created in error - please disregard.

## 2017-06-10 ENCOUNTER — Encounter: Payer: Self-pay | Admitting: *Deleted

## 2017-06-10 ENCOUNTER — Other Ambulatory Visit: Payer: Self-pay | Admitting: *Deleted

## 2017-06-10 ENCOUNTER — Encounter: Payer: Self-pay | Admitting: Internal Medicine

## 2017-06-10 DIAGNOSIS — I69398 Other sequelae of cerebral infarction: Secondary | ICD-10-CM | POA: Diagnosis not present

## 2017-06-10 DIAGNOSIS — M6281 Muscle weakness (generalized): Secondary | ICD-10-CM | POA: Diagnosis not present

## 2017-06-10 NOTE — Patient Outreach (Signed)
[facility]) Care Management  06/10/2017  PASCO MARCHITTO May 26, 1940 [id number]   CSW was able to make initial contact with patient's wife, Jentry Warnell today to perform the phone assessment, as well as assess and assist with social work needs and services.  CSW introduced self, explained role and types of services provided through [facility] Care Management [facility] Care Management).  CSW further explained to Mrs. Alvira that CSW works with patient's Telephonic RNCM, also with [facility] Care Management, Fleeta Emmer. CSW then explained the reason for the call, indicating that Mrs. Karilyn Cota thought that patient would benefit from social work services and resources to assist with arranging short-term placement for patient in a skilled nursing facility to receive rehabilitative services.  CSW obtained two HIPAA compliant identifiers from Mrs. Tyler Deis, which included patient's name and date of birth. Mrs. Aday admits that she is no longer able to adequately care for patient in the home, as patient is currently bed bound and unable to perform activities of daily living independently.  Patient is also experiencing some memory deficits, "becoming increasingly confused each day".  Mrs. Borsuk went on to say that she would like to have patient placed in a skilled nursing facility, as soon as possible, to receive short-term rehabilitative services before he becomes even more deconditioned.  CSW explained the placement process to Mrs. Limbach, informing her that CSW would be getting in contact with patient's Primary Care Physician, Dr. [name] to request a completed and signed FL-2 Form.  CSW then agreed to fax the FL-2 Form to all skilled nursing facilities of choice.  Mrs. Kenley indicated that she does not have a preference, wanting CSW to place patient at the first facility that makes a bed offer.  CSW agreed to follow-up with Mrs. Main as soon as patient's FL-2 Form has been faxed  out, or at least by Monday, June 15, 2017.  Patient is currently receiving home health services in the form of a nurse, physical therapist and aide. [facility] CM Care Plan Problem One     Most Recent Value  Care Plan Problem One  Level of care issues.  Role Documenting the Problem One  Clinical Social Worker  Care Plan for Problem One  Active  [facility] Long Term Goal   Patient will receive 24 hour care and supervision in a skilled facility that also offers rehabilitative services, within the next 45 days.  [facility] Long Term Goal Start Date  06/10/17  Interventions for Problem One Long Term Goal  CSW will assist patient and wife with short-term placement for rehabilitative services in a skilled nursing facility.  [facility] CM Short Term Goal #1   Patient's wife will review list of all skilled nursing facilities in [facility] and decide on at least three skilled nursing facilities of choice, within the next two weeks.  [facility] CM Short Term Goal #1 Start Date  06/10/17  Interventions for Short Term Goal #1  CSW has provided patient's wife with a list of all skilled nursing facilities in [facility] for her review.  Patient's wife has also been encouraged to go and tour facilities of interest.  [facility] CM Short Term Goal #2   Patient's wife will contact patient's primary care phyisican on Monday, June 15, 2017 if patient's FL-2 Form has not been completed within the next three days.  [facility] CM Short Term Goal #2 Start Date  06/10/17  Interventions for Short Term Goal #2  CSW will place a phone call  to patient's primary care physician, as well as send an In Basket message in [facility] requesting that an FL-2 Form be completed and signed on patient at their earliest convenience.    [name], BSW, MSW, LCSW  Licensed Restaurant manager, fast food [facility]  [address]ailing [place] [address], [place], [place] [address] Physical Address-[address], [place], [place] [address] Toll  Free Main # [phone] Fax # [phone] Cell # [phone]  Office # [phone] [email or web address]

## 2017-06-11 ENCOUNTER — Telehealth: Payer: Self-pay | Admitting: Internal Medicine

## 2017-06-11 NOTE — Telephone Encounter (Signed)
Copied from CRM [id number]. Topic: Quick Communication - See Telephone Encounter >> Jun 11, 2017 10:45 AM Cipriano Bunker wrote: CRM for notification. See Telephone encounter for: 06/11/17.  Darl Pikes from [facility] [phone] calling to report a fall. Had multiple falls over days,  no significant injuries per wife on phone.   Putting in for social worker to get him in a place.  She said will need a FL2

## 2017-06-12 ENCOUNTER — Ambulatory Visit: Payer: Self-pay | Admitting: *Deleted

## 2017-06-12 ENCOUNTER — Telehealth: Payer: Self-pay | Admitting: Internal Medicine

## 2017-06-12 DIAGNOSIS — I69398 Other sequelae of cerebral infarction: Secondary | ICD-10-CM | POA: Diagnosis not present

## 2017-06-12 DIAGNOSIS — M6281 Muscle weakness (generalized): Secondary | ICD-10-CM | POA: Diagnosis not present

## 2017-06-12 NOTE — Telephone Encounter (Signed)
FL2 has been completed and sign, faxed to Darl PikesSusan, Charity fundraiserN at [facility].

## 2017-06-12 NOTE — Telephone Encounter (Signed)
Clearence Cheek, RN Clinical Manager with [facility] called and says "the patient has fallen several times in the last 10 days. On 06/08/17, the patient was sent to the ED because of a fall. He was evaluated and sent back home, everything was negative. The OT went in, who hasn't seen the patient in a week, and says the patient has declined significantly in his ability to transfer and perform his ADL's, which 1 week ago he was doing it all. I will have a RN go in on Monday to assess him. We are trying to place him, a FL2 was faxed over to Dr. Lawerance Bach today and as soon as we get that back, we can go ahead and place him early next week. I just wanted her to know about his decline and offer her recommendation, if any." I advised this will be sent to Dr. Lawerance Bach for review.

## 2017-06-15 ENCOUNTER — Ambulatory Visit: Payer: Medicare Other

## 2017-06-15 ENCOUNTER — Other Ambulatory Visit: Payer: Self-pay | Admitting: *Deleted

## 2017-06-15 DIAGNOSIS — I69398 Other sequelae of cerebral infarction: Secondary | ICD-10-CM | POA: Diagnosis not present

## 2017-06-15 DIAGNOSIS — M6281 Muscle weakness (generalized): Secondary | ICD-10-CM | POA: Diagnosis not present

## 2017-06-15 NOTE — Patient Outreach (Signed)
[facility]) Care Management  06/15/2017  Cody [name] November 11, 1940 [id number]   CSW received an incoming call from Sydell Axon, [place] Social Worker with [facility], indicating that she is working on initiating short-term placement for patient into a skilled nursing facility for rehabilitative services.  CSW explained to Mrs. Clapp that [facility] had requested an FL-2 Form from patient's Primary Care Physician, Dr. Billey Gosling, but was happy to hear that Mrs. Laymond Purser has already obtained a completed and signed FL-2 Form.  Not to mention, Mrs. Laymond Purser reported that she has already faxed the FL-2 Form to all of the following skilled nursing facilities: [facility] [place] [facility] Mrs. Laymond Purser indicated that she plans to follow-up with each of the above named facilities today to see if any of them are willing to make bed offers on patient.  Mrs. Clapp then agreed to contact CSW and patient's wife, Shemuel Harkleroad to report findings.  Mrs. Clapp and CSW are aware that patient needs to be placed as soon as possible, as Mrs. Ortwein reports that she is no longer able to adequately care for patient in the home.   In the meantime, CSW has made contact with Mrs. Speegle to inform her of CSW's contact with Mrs. Clapp.  Mrs. Petzold voiced understanding and was agreeable to this plan.  Mrs. Schweitzer reported that she is hopeful that patient will be placed within the next few days, as she was unable to change patient's diaper for a little more than 24 hours so he is starting to experiencing skin-breakdown.  Mrs. Bondar indicated that she does not have any facilities of interest, just wanting patient to be placed as soon as possible.  Mrs. Mcclafferty reports that patient will be private pay at the facility, which will certainly help  with patient receiving bed offers.  CSW will continue to follow along to be of assistance to Mrs. Mee Hives, as well as Mrs. Clapp.  CSW will make contact with Mrs. Mee Hives and Mrs. Clapp within the next few days, if a return call is not received from either in the meantime. [facility] CM Care Plan Problem One     Most Recent Value  Care Plan Problem One  Level of care issues.  Role Documenting the Problem One  Clinical Social Worker  Care Plan for Problem One  Active  [facility] Long Term Goal   Patient will receive 24 hour care and supervision in a skilled facility that also offers rehabilitative services, within the next 45 days.  [facility] Long Term Goal Start Date  06/10/17  [facility] CM Short Term Goal #1   Patient's wife will review list of all skilled nursing facilities in [facility] and decide on at least three skilled nursing facilities of choice, within the next two weeks.  [facility] CM Short Term Goal #1 Start Date  06/10/17  [facility] CM Short Term Goal #1 Met Date  06/15/17  Interventions for Short Term Goal #1  Patient's wife was able to provide CSW with a list of skilled nursing facilities of interest.  [facility] CM Short Term Goal #2   Patient's wife will contact patient's primary care phyisican on Monday, June 15, 2017 if patient's FL-2 Form has not been completed within the next three days.  [facility] CM Short Term Goal #2 Start Date  06/10/17  [facility] CM Short Term  Goal #2 Met Date  06/15/17  Interventions for Short Term Goal #2  Patient's FL-2 Form has been obtained from patient's primary care physician and faxed to all skilled nursing facilities of choice.    Nat Christen, BSW, MSW, LCSW  Licensed Education officer, environmental [facility]  Mailing [place] [address], [place], [place] [address] Physical Address-[address], [place], [place] [address] Toll Free Main # [phone] Fax # [phone] Cell # [phone]  Office #  [phone] [email or web address]

## 2017-06-15 NOTE — Telephone Encounter (Signed)
Refaxed again this morning. 

## 2017-06-16 ENCOUNTER — Other Ambulatory Visit: Payer: Self-pay | Admitting: *Deleted

## 2017-06-16 DIAGNOSIS — M6281 Muscle weakness (generalized): Secondary | ICD-10-CM | POA: Diagnosis not present

## 2017-06-16 DIAGNOSIS — I69398 Other sequelae of cerebral infarction: Secondary | ICD-10-CM | POA: Diagnosis not present

## 2017-06-16 NOTE — Patient Outreach (Signed)
[facility](THN) Care Management  06/16/2017  Cody [name] 07/15/1940 [id number]   CSW was able to make contact with patient's wife, Cody [name] today to ensure that patient will be placed at [name] [place] on Thursday, June 18, 2017 to receive short-term rehabilitative services.  Mrs. Cody [name] reports that she has already secured the admissions paperwork with the facility and is aware of how much she will be responsible for paying out-of-pocket, per day.  CSW will work with Cody [name], Home Health Social Worker with [facility], to arrange non-emergency ambulance transport for [facility] to [name]).  Mrs. Cody [name] admits that she is not strong enough to try and lift patient into the car.  Mrs. Cody [name] is aware that patient's [employer] Insurance will not cover the cost of the ambulance transport, as it is not considered a medical necessity. CSW will transfer patient's case to Cody LevyJanet Caldwell, LCSW colleague, also with [facility], as Ms. Cody [name] provides social work coverage for that particular facility.  No additional social work needs have been identified at this time.  Ms. Cody [name] will assist with discharge planning needs and services from the skilled nursing facility. Danford BadJoanna Zavior [name], BSW, MSW, LCSW  Licensed Restaurant manager, fast foodClinical Social Worker  [facility] [place] [facility]  Mailing [place], [place], [place] [address] Physical Address-[address], [place], [place] [address] Toll Free Main # [phone] Fax # [phone] Cell # [phone]  Office # [phone] [email or web address]

## 2017-06-17 ENCOUNTER — Ambulatory Visit: Payer: BLUE CROSS/BLUE SHIELD | Admitting: Podiatry

## 2017-06-17 DIAGNOSIS — M6281 Muscle weakness (generalized): Secondary | ICD-10-CM | POA: Diagnosis not present

## 2017-06-17 DIAGNOSIS — I69398 Other sequelae of cerebral infarction: Secondary | ICD-10-CM | POA: Diagnosis not present

## 2017-06-19 DIAGNOSIS — M25559 Pain in unspecified hip: Secondary | ICD-10-CM | POA: Diagnosis not present

## 2017-06-19 DIAGNOSIS — M545 Low back pain: Secondary | ICD-10-CM | POA: Diagnosis not present

## 2017-06-19 DIAGNOSIS — Z79899 Other long term (current) drug therapy: Secondary | ICD-10-CM | POA: Diagnosis not present

## 2017-06-20 DIAGNOSIS — Z9181 History of falling: Secondary | ICD-10-CM | POA: Diagnosis not present

## 2017-06-20 DIAGNOSIS — M6281 Muscle weakness (generalized): Secondary | ICD-10-CM | POA: Diagnosis not present

## 2017-06-20 DIAGNOSIS — R2681 Unsteadiness on feet: Secondary | ICD-10-CM | POA: Diagnosis not present

## 2017-06-20 DIAGNOSIS — Z8673 Personal history of transient ischemic attack (TIA), and cerebral infarction without residual deficits: Secondary | ICD-10-CM | POA: Diagnosis not present

## 2017-06-20 DIAGNOSIS — R1310 Dysphagia, unspecified: Secondary | ICD-10-CM | POA: Diagnosis not present

## 2017-06-20 DIAGNOSIS — M6389 Disorders of muscle in diseases classified elsewhere, multiple sites: Secondary | ICD-10-CM | POA: Diagnosis not present

## 2017-06-20 DIAGNOSIS — R41841 Cognitive communication deficit: Secondary | ICD-10-CM | POA: Diagnosis not present

## 2017-06-21 DIAGNOSIS — N400 Enlarged prostate without lower urinary tract symptoms: Secondary | ICD-10-CM | POA: Diagnosis not present

## 2017-06-21 DIAGNOSIS — I10 Essential (primary) hypertension: Secondary | ICD-10-CM | POA: Diagnosis not present

## 2017-06-21 DIAGNOSIS — R2689 Other abnormalities of gait and mobility: Secondary | ICD-10-CM | POA: Diagnosis not present

## 2017-06-21 DIAGNOSIS — N3281 Overactive bladder: Secondary | ICD-10-CM | POA: Diagnosis not present

## 2017-06-21 DIAGNOSIS — E7849 Other hyperlipidemia: Secondary | ICD-10-CM | POA: Diagnosis not present

## 2017-06-21 DIAGNOSIS — M545 Low back pain: Secondary | ICD-10-CM | POA: Diagnosis not present

## 2017-06-21 DIAGNOSIS — G309 Alzheimer's disease, unspecified: Secondary | ICD-10-CM | POA: Diagnosis not present

## 2017-06-22 ENCOUNTER — Other Ambulatory Visit: Payer: Self-pay | Admitting: *Deleted

## 2017-06-22 DIAGNOSIS — R1310 Dysphagia, unspecified: Secondary | ICD-10-CM | POA: Diagnosis not present

## 2017-06-22 DIAGNOSIS — M6389 Disorders of muscle in diseases classified elsewhere, multiple sites: Secondary | ICD-10-CM | POA: Diagnosis not present

## 2017-06-22 DIAGNOSIS — R41841 Cognitive communication deficit: Secondary | ICD-10-CM | POA: Diagnosis not present

## 2017-06-22 DIAGNOSIS — Z8673 Personal history of transient ischemic attack (TIA), and cerebral infarction without residual deficits: Secondary | ICD-10-CM | POA: Diagnosis not present

## 2017-06-22 DIAGNOSIS — R2681 Unsteadiness on feet: Secondary | ICD-10-CM | POA: Diagnosis not present

## 2017-06-22 DIAGNOSIS — Z9181 History of falling: Secondary | ICD-10-CM | POA: Diagnosis not present

## 2017-06-22 NOTE — Patient Outreach (Signed)
[facility](THN) Care Management  06/22/2017  Colbert CoyerJohn E Amos [birth year] [id number]   CSW contacted [facility] today where it was confirmed that pt is there in room 301.  CSW left message for SNF rep to call back with any info/updates on pt- progress, dc plans, etc.  CSW will plan a SNF visit later this week to meet and introduce self to pt and to discuss further THN involvement and support.   Reece LevyJanet Alleene Stoy, MSW, LCSW Clinical Social Worker  [facility] [phone]

## 2017-06-23 DIAGNOSIS — Z8673 Personal history of transient ischemic attack (TIA), and cerebral infarction without residual deficits: Secondary | ICD-10-CM | POA: Diagnosis not present

## 2017-06-23 DIAGNOSIS — L89153 Pressure ulcer of sacral region, stage 3: Secondary | ICD-10-CM | POA: Diagnosis not present

## 2017-06-23 DIAGNOSIS — R41841 Cognitive communication deficit: Secondary | ICD-10-CM | POA: Diagnosis not present

## 2017-06-23 DIAGNOSIS — Z9181 History of falling: Secondary | ICD-10-CM | POA: Diagnosis not present

## 2017-06-23 DIAGNOSIS — R1310 Dysphagia, unspecified: Secondary | ICD-10-CM | POA: Diagnosis not present

## 2017-06-23 DIAGNOSIS — M6389 Disorders of muscle in diseases classified elsewhere, multiple sites: Secondary | ICD-10-CM | POA: Diagnosis not present

## 2017-06-23 DIAGNOSIS — R2681 Unsteadiness on feet: Secondary | ICD-10-CM | POA: Diagnosis not present

## 2017-06-24 DIAGNOSIS — R41841 Cognitive communication deficit: Secondary | ICD-10-CM | POA: Diagnosis not present

## 2017-06-24 DIAGNOSIS — R1310 Dysphagia, unspecified: Secondary | ICD-10-CM | POA: Diagnosis not present

## 2017-06-24 DIAGNOSIS — Z8673 Personal history of transient ischemic attack (TIA), and cerebral infarction without residual deficits: Secondary | ICD-10-CM | POA: Diagnosis not present

## 2017-06-24 DIAGNOSIS — M6389 Disorders of muscle in diseases classified elsewhere, multiple sites: Secondary | ICD-10-CM | POA: Diagnosis not present

## 2017-06-24 DIAGNOSIS — Z9181 History of falling: Secondary | ICD-10-CM | POA: Diagnosis not present

## 2017-06-24 DIAGNOSIS — R2681 Unsteadiness on feet: Secondary | ICD-10-CM | POA: Diagnosis not present

## 2017-06-25 DIAGNOSIS — Z9181 History of falling: Secondary | ICD-10-CM | POA: Diagnosis not present

## 2017-06-25 DIAGNOSIS — R2681 Unsteadiness on feet: Secondary | ICD-10-CM | POA: Diagnosis not present

## 2017-06-25 DIAGNOSIS — Z8673 Personal history of transient ischemic attack (TIA), and cerebral infarction without residual deficits: Secondary | ICD-10-CM | POA: Diagnosis not present

## 2017-06-25 DIAGNOSIS — M6389 Disorders of muscle in diseases classified elsewhere, multiple sites: Secondary | ICD-10-CM | POA: Diagnosis not present

## 2017-06-25 DIAGNOSIS — R41841 Cognitive communication deficit: Secondary | ICD-10-CM | POA: Diagnosis not present

## 2017-06-25 DIAGNOSIS — R1310 Dysphagia, unspecified: Secondary | ICD-10-CM | POA: Diagnosis not present

## 2017-06-26 ENCOUNTER — Other Ambulatory Visit: Payer: Self-pay | Admitting: *Deleted

## 2017-06-26 ENCOUNTER — Ambulatory Visit: Payer: BLUE CROSS/BLUE SHIELD | Admitting: Podiatry

## 2017-06-26 DIAGNOSIS — Z9181 History of falling: Secondary | ICD-10-CM | POA: Diagnosis not present

## 2017-06-26 DIAGNOSIS — M6389 Disorders of muscle in diseases classified elsewhere, multiple sites: Secondary | ICD-10-CM | POA: Diagnosis not present

## 2017-06-26 DIAGNOSIS — R41841 Cognitive communication deficit: Secondary | ICD-10-CM | POA: Diagnosis not present

## 2017-06-26 DIAGNOSIS — R1310 Dysphagia, unspecified: Secondary | ICD-10-CM | POA: Diagnosis not present

## 2017-06-26 DIAGNOSIS — R2681 Unsteadiness on feet: Secondary | ICD-10-CM | POA: Diagnosis not present

## 2017-06-26 DIAGNOSIS — Z8673 Personal history of transient ischemic attack (TIA), and cerebral infarction without residual deficits: Secondary | ICD-10-CM | POA: Diagnosis not present

## 2017-06-26 NOTE — Patient Outreach (Signed)
[facility](THN) Care Management  06/26/2017  Colbert CoyerJohn E Magro [birth year] [id number]    CSW received a return call from SNF rep at [facility], [facility] where pt resides for rehab. Per SNF rep, pt is for long term placement there; and is currently private pay.   CSW was also able to speak with wife by phone who thought CSW had planned appointment with her for today. CSW explained to pt's wife that plan had been to visit today at SNF with pt, however, given plans for long term placement CSW would not be enrolling [facility].  Per wife, she is still hoping to maybe get him home; stating, "I paid for a month but still hope he will get well enough to come home".   CSW discussed [facility] and the different disciplines services.  CSW will plan a follow up SNF visit next week and phone outreach call to wife.   CSW provided wife with contact # for CSW and encouraged her to call with any questions or needs.    Reece LevyJanet Hamdi Kley, MSW, LCSW Clinical Social Worker  [facility] [phone]

## 2017-06-27 DIAGNOSIS — R2681 Unsteadiness on feet: Secondary | ICD-10-CM | POA: Diagnosis not present

## 2017-06-27 DIAGNOSIS — Z8673 Personal history of transient ischemic attack (TIA), and cerebral infarction without residual deficits: Secondary | ICD-10-CM | POA: Diagnosis not present

## 2017-06-27 DIAGNOSIS — Z9181 History of falling: Secondary | ICD-10-CM | POA: Diagnosis not present

## 2017-06-27 DIAGNOSIS — R1310 Dysphagia, unspecified: Secondary | ICD-10-CM | POA: Diagnosis not present

## 2017-06-27 DIAGNOSIS — M6389 Disorders of muscle in diseases classified elsewhere, multiple sites: Secondary | ICD-10-CM | POA: Diagnosis not present

## 2017-06-27 DIAGNOSIS — R41841 Cognitive communication deficit: Secondary | ICD-10-CM | POA: Diagnosis not present

## 2017-06-29 DIAGNOSIS — R1310 Dysphagia, unspecified: Secondary | ICD-10-CM | POA: Diagnosis not present

## 2017-06-29 DIAGNOSIS — M6389 Disorders of muscle in diseases classified elsewhere, multiple sites: Secondary | ICD-10-CM | POA: Diagnosis not present

## 2017-06-29 DIAGNOSIS — R2681 Unsteadiness on feet: Secondary | ICD-10-CM | POA: Diagnosis not present

## 2017-06-29 DIAGNOSIS — Z8673 Personal history of transient ischemic attack (TIA), and cerebral infarction without residual deficits: Secondary | ICD-10-CM | POA: Diagnosis not present

## 2017-06-29 DIAGNOSIS — Z9181 History of falling: Secondary | ICD-10-CM | POA: Diagnosis not present

## 2017-06-29 DIAGNOSIS — R41841 Cognitive communication deficit: Secondary | ICD-10-CM | POA: Diagnosis not present

## 2017-06-30 ENCOUNTER — Ambulatory Visit: Payer: Medicare Other | Admitting: *Deleted

## 2017-06-30 ENCOUNTER — Other Ambulatory Visit: Payer: Self-pay | Admitting: *Deleted

## 2017-06-30 DIAGNOSIS — R41841 Cognitive communication deficit: Secondary | ICD-10-CM | POA: Diagnosis not present

## 2017-06-30 DIAGNOSIS — Z8673 Personal history of transient ischemic attack (TIA), and cerebral infarction without residual deficits: Secondary | ICD-10-CM | POA: Diagnosis not present

## 2017-06-30 DIAGNOSIS — M6389 Disorders of muscle in diseases classified elsewhere, multiple sites: Secondary | ICD-10-CM | POA: Diagnosis not present

## 2017-06-30 DIAGNOSIS — Z9181 History of falling: Secondary | ICD-10-CM | POA: Diagnosis not present

## 2017-06-30 DIAGNOSIS — R2681 Unsteadiness on feet: Secondary | ICD-10-CM | POA: Diagnosis not present

## 2017-06-30 DIAGNOSIS — R1310 Dysphagia, unspecified: Secondary | ICD-10-CM | POA: Diagnosis not present

## 2017-06-30 NOTE — Patient Outreach (Signed)
[facility] Care Management  06/30/2017  [name] 1940-03-23 [id number]   CSW met with patient and his wife at SNF today. Pt was alert, oriented and shared with CSW that he and his wife have been married for 60 years. He was a bit tearful talking about his wife and family prior to her arrival and was proud to share about his children, grandchildren, etc.   CSW provided pt's wife with a [facility] packet and briefly discussed the program, roles/disciplines and reason for my visit.  Per SNF rep, pt is private pay at SNF with plans for long term placement. Per wife, she is still hopeful he will rehabilitate to a level she can manage at home; "he needs to be able to walk and take care of himself".  Pt required a hoyer lift transfer to wheelchair by SNF staff while CSW visited. CSW walked with wife pushing pt to dining room down the hall. [name] asked CSW if there would be a meeting with the therapist to talk about pt. CSW advised wife that the SNF staff would be the ones to arrange that and I was able to speak to Bryson Ha (Geographical information systems officer) and Andee Poles ([place]) about this request prior to leaving the SNF. Per SNFstaff, pt is there as a private pay/long term care patient and is not getting extensive therapy.  Advised them of my conversation with wife and have asked them to follow up with her about the plans since there appears to be some uncertainty as to what he is getting with therapy wise) as well as regarding dc plans.  CSW will plan a f/u call to pt's wife and/or SNF call/visit in the next 7-10 days.    Eduard Clos, MSW, [place] Worker  [place] [phone]

## 2017-07-01 DIAGNOSIS — R2681 Unsteadiness on feet: Secondary | ICD-10-CM | POA: Diagnosis not present

## 2017-07-01 DIAGNOSIS — R41841 Cognitive communication deficit: Secondary | ICD-10-CM | POA: Diagnosis not present

## 2017-07-01 DIAGNOSIS — R1310 Dysphagia, unspecified: Secondary | ICD-10-CM | POA: Diagnosis not present

## 2017-07-01 DIAGNOSIS — M6389 Disorders of muscle in diseases classified elsewhere, multiple sites: Secondary | ICD-10-CM | POA: Diagnosis not present

## 2017-07-01 DIAGNOSIS — Z9181 History of falling: Secondary | ICD-10-CM | POA: Diagnosis not present

## 2017-07-01 DIAGNOSIS — Z8673 Personal history of transient ischemic attack (TIA), and cerebral infarction without residual deficits: Secondary | ICD-10-CM | POA: Diagnosis not present

## 2017-07-02 DIAGNOSIS — Z9181 History of falling: Secondary | ICD-10-CM | POA: Diagnosis not present

## 2017-07-02 DIAGNOSIS — R41841 Cognitive communication deficit: Secondary | ICD-10-CM | POA: Diagnosis not present

## 2017-07-02 DIAGNOSIS — R2681 Unsteadiness on feet: Secondary | ICD-10-CM | POA: Diagnosis not present

## 2017-07-02 DIAGNOSIS — Z8673 Personal history of transient ischemic attack (TIA), and cerebral infarction without residual deficits: Secondary | ICD-10-CM | POA: Diagnosis not present

## 2017-07-02 DIAGNOSIS — M6389 Disorders of muscle in diseases classified elsewhere, multiple sites: Secondary | ICD-10-CM | POA: Diagnosis not present

## 2017-07-02 DIAGNOSIS — R1310 Dysphagia, unspecified: Secondary | ICD-10-CM | POA: Diagnosis not present

## 2017-07-03 DIAGNOSIS — R1310 Dysphagia, unspecified: Secondary | ICD-10-CM | POA: Diagnosis not present

## 2017-07-03 DIAGNOSIS — R41841 Cognitive communication deficit: Secondary | ICD-10-CM | POA: Diagnosis not present

## 2017-07-03 DIAGNOSIS — M6389 Disorders of muscle in diseases classified elsewhere, multiple sites: Secondary | ICD-10-CM | POA: Diagnosis not present

## 2017-07-03 DIAGNOSIS — Z8673 Personal history of transient ischemic attack (TIA), and cerebral infarction without residual deficits: Secondary | ICD-10-CM | POA: Diagnosis not present

## 2017-07-03 DIAGNOSIS — R2681 Unsteadiness on feet: Secondary | ICD-10-CM | POA: Diagnosis not present

## 2017-07-03 DIAGNOSIS — Z9181 History of falling: Secondary | ICD-10-CM | POA: Diagnosis not present

## 2017-07-04 DIAGNOSIS — Z8673 Personal history of transient ischemic attack (TIA), and cerebral infarction without residual deficits: Secondary | ICD-10-CM | POA: Diagnosis not present

## 2017-07-04 DIAGNOSIS — R1310 Dysphagia, unspecified: Secondary | ICD-10-CM | POA: Diagnosis not present

## 2017-07-04 DIAGNOSIS — M6389 Disorders of muscle in diseases classified elsewhere, multiple sites: Secondary | ICD-10-CM | POA: Diagnosis not present

## 2017-07-04 DIAGNOSIS — Z9181 History of falling: Secondary | ICD-10-CM | POA: Diagnosis not present

## 2017-07-04 DIAGNOSIS — R41841 Cognitive communication deficit: Secondary | ICD-10-CM | POA: Diagnosis not present

## 2017-07-04 DIAGNOSIS — R2681 Unsteadiness on feet: Secondary | ICD-10-CM | POA: Diagnosis not present

## 2017-07-06 DIAGNOSIS — Z8673 Personal history of transient ischemic attack (TIA), and cerebral infarction without residual deficits: Secondary | ICD-10-CM | POA: Diagnosis not present

## 2017-07-06 DIAGNOSIS — Z9181 History of falling: Secondary | ICD-10-CM | POA: Diagnosis not present

## 2017-07-06 DIAGNOSIS — R2681 Unsteadiness on feet: Secondary | ICD-10-CM | POA: Diagnosis not present

## 2017-07-06 DIAGNOSIS — R1310 Dysphagia, unspecified: Secondary | ICD-10-CM | POA: Diagnosis not present

## 2017-07-06 DIAGNOSIS — M6389 Disorders of muscle in diseases classified elsewhere, multiple sites: Secondary | ICD-10-CM | POA: Diagnosis not present

## 2017-07-06 DIAGNOSIS — R41841 Cognitive communication deficit: Secondary | ICD-10-CM | POA: Diagnosis not present

## 2017-07-07 DIAGNOSIS — M6389 Disorders of muscle in diseases classified elsewhere, multiple sites: Secondary | ICD-10-CM | POA: Diagnosis not present

## 2017-07-07 DIAGNOSIS — Z9181 History of falling: Secondary | ICD-10-CM | POA: Diagnosis not present

## 2017-07-07 DIAGNOSIS — R2681 Unsteadiness on feet: Secondary | ICD-10-CM | POA: Diagnosis not present

## 2017-07-07 DIAGNOSIS — R41841 Cognitive communication deficit: Secondary | ICD-10-CM | POA: Diagnosis not present

## 2017-07-07 DIAGNOSIS — L89153 Pressure ulcer of sacral region, stage 3: Secondary | ICD-10-CM | POA: Diagnosis not present

## 2017-07-07 DIAGNOSIS — Z8673 Personal history of transient ischemic attack (TIA), and cerebral infarction without residual deficits: Secondary | ICD-10-CM | POA: Diagnosis not present

## 2017-07-07 DIAGNOSIS — R1310 Dysphagia, unspecified: Secondary | ICD-10-CM | POA: Diagnosis not present

## 2017-07-08 ENCOUNTER — Other Ambulatory Visit: Payer: Self-pay | Admitting: *Deleted

## 2017-07-08 DIAGNOSIS — R1310 Dysphagia, unspecified: Secondary | ICD-10-CM | POA: Diagnosis not present

## 2017-07-08 DIAGNOSIS — R2681 Unsteadiness on feet: Secondary | ICD-10-CM | POA: Diagnosis not present

## 2017-07-08 DIAGNOSIS — Z9181 History of falling: Secondary | ICD-10-CM | POA: Diagnosis not present

## 2017-07-08 DIAGNOSIS — Z8673 Personal history of transient ischemic attack (TIA), and cerebral infarction without residual deficits: Secondary | ICD-10-CM | POA: Diagnosis not present

## 2017-07-08 DIAGNOSIS — M6389 Disorders of muscle in diseases classified elsewhere, multiple sites: Secondary | ICD-10-CM | POA: Diagnosis not present

## 2017-07-08 DIAGNOSIS — R41841 Cognitive communication deficit: Secondary | ICD-10-CM | POA: Diagnosis not present

## 2017-07-08 NOTE — Patient Outreach (Signed)
[facility]) Care Management  [facility] Social Work  07/08/2017  MAINOR HELLMANN Oct 16, 1940 [id number]  Subjective:  Per wife,  "he forgets on the weekends what he learned in physical therapy during the week".   Objective:  [facility] CSW to assist patient and family with community based resources to aide in their well-being, quality of life and overall safety and needs.   Encounter Medications:  Outpatient Encounter Medications as of 07/08/2017  Medication Sig  . amLODipine (NORVASC) 2.5 MG tablet Take 1 tablet (2.5 mg total) by mouth daily.  Marland Kitchen aspirin 325 MG EC tablet Take 1 tablet (325 mg total) by mouth daily.  Marland Kitchen atorvastatin (LIPITOR) 80 MG tablet Take 1 tablet (80 mg total) by mouth daily at 6 PM.  . docusate sodium (COLACE) 100 MG capsule Take 100 mg by mouth every evening.  . hydrOXYzine (ATARAX/VISTARIL) 25 MG tablet Take 12.5 mg by mouth every other day.  . loratadine (CLARITIN) 10 MG tablet Take 10 mg by mouth daily.  . Multiple Vitamins-Minerals (ADULT ONE DAILY GUMMIES) CHEW Chew 2 each by mouth every evening.   Marland Kitchen omeprazole (PRILOSEC) 20 MG capsule Take 1 capsule (20 mg total) by mouth daily before breakfast.  . pentosan polysulfate (ELMIRON) 100 MG capsule Take 2 capsules (200 mg total) by mouth daily. (Patient taking differently: Take 100 mg by mouth every evening. )  . solifenacin (VESICARE) 10 MG tablet Take 10 mg by mouth every evening.  . tamsulosin (FLOMAX) 0.4 MG CAPS Take 1 capsule (0.4 mg total) by mouth daily. (Patient taking differently: Take 0.4 mg by mouth daily after supper. )   No facility-administered encounter medications on file as of 07/08/2017.     Functional Status:  In your present state of health, do you have any difficulty performing the following activities: 06/10/2017  Hearing? N  Vision? Y  Difficulty concentrating or making decisions? Y  Walking or climbing stairs? Y  Dressing or bathing? Y  Doing errands, shopping? Y  Preparing Food  and eating ? Y  Using the Toilet? Y  In the past six months, have you accidently leaked urine? Y  Do you have problems with loss of bowel control? N  Managing your Medications? Y  Managing your Finances? Y  Housekeeping or managing your Housekeeping? Y  Some recent data might be hidden    Fall/Depression Screening:  PHQ 2/9 Scores 06/10/2017 04/07/2016  PHQ - 2 Score 0 0  Exception Documentation Other- indicate reason in comment box -    Assessment: Pt remains at SNF rehab; private pay. Per wife, he is making slow progress.   Plan:  [facility] CM Care Plan Problem One     Most Recent Value  Care Plan Problem One  Level of care issues.  Role Documenting the Problem One  Clinical Social Worker  Care Plan for Problem One  Active  [facility] Long Term Goal   Patient and wife will have a long term plan in place within the next 31 days.   [facility] Long Term Goal Start Date  07/08/17  Interventions for Problem One Long Term Goal  Wife reports pt is still making slow progress at SNF. Wife is planning to meet with an Elder Law Attorney 07/22/2017 to discuss long term options/payment for SNF.  Wife reports she has spoken with the SNF staff and they are recommending long term placement.   [facility] CM Short Term Goal #1   Patient's wife will meet with Attorney to determine long term  plans and payment options for SNF in the next 20 days.   [facility] CM Short Term Goal #1 Start Date  07/08/17  Interventions for Short Term Goal #1  Wife has planned meeting with lawyer on 07/22/2017. Wife continues to pay privately for SNF care at [facility].   [facility] CM Short Term Goal #2   Wife will voice undertanding of options for long term SNF and in home support/options in the next 30 days.   [facility] CM Short Term Goal #2 Start Date  07/08/17  Interventions for Short Term Goal #2  CSW discussed Medicaid, private pay and options for in home care -vs- long term SNF placment. Wife is considering both options.         CSW will plan SNF visit and call to  wife in the next 7-10 days.    Reece LevyJanet Grier Vu, MSW, LCSW Clinical Social Worker  [facility] [phone]

## 2017-07-09 DIAGNOSIS — R41841 Cognitive communication deficit: Secondary | ICD-10-CM | POA: Diagnosis not present

## 2017-07-09 DIAGNOSIS — M6389 Disorders of muscle in diseases classified elsewhere, multiple sites: Secondary | ICD-10-CM | POA: Diagnosis not present

## 2017-07-09 DIAGNOSIS — R1310 Dysphagia, unspecified: Secondary | ICD-10-CM | POA: Diagnosis not present

## 2017-07-09 DIAGNOSIS — Z9181 History of falling: Secondary | ICD-10-CM | POA: Diagnosis not present

## 2017-07-09 DIAGNOSIS — Z8673 Personal history of transient ischemic attack (TIA), and cerebral infarction without residual deficits: Secondary | ICD-10-CM | POA: Diagnosis not present

## 2017-07-09 DIAGNOSIS — R2681 Unsteadiness on feet: Secondary | ICD-10-CM | POA: Diagnosis not present

## 2017-07-10 DIAGNOSIS — R41841 Cognitive communication deficit: Secondary | ICD-10-CM | POA: Diagnosis not present

## 2017-07-10 DIAGNOSIS — Z8673 Personal history of transient ischemic attack (TIA), and cerebral infarction without residual deficits: Secondary | ICD-10-CM | POA: Diagnosis not present

## 2017-07-10 DIAGNOSIS — Z9181 History of falling: Secondary | ICD-10-CM | POA: Diagnosis not present

## 2017-07-10 DIAGNOSIS — R2681 Unsteadiness on feet: Secondary | ICD-10-CM | POA: Diagnosis not present

## 2017-07-10 DIAGNOSIS — M6389 Disorders of muscle in diseases classified elsewhere, multiple sites: Secondary | ICD-10-CM | POA: Diagnosis not present

## 2017-07-10 DIAGNOSIS — R1310 Dysphagia, unspecified: Secondary | ICD-10-CM | POA: Diagnosis not present

## 2017-07-13 DIAGNOSIS — Z9181 History of falling: Secondary | ICD-10-CM | POA: Diagnosis not present

## 2017-07-13 DIAGNOSIS — Z8673 Personal history of transient ischemic attack (TIA), and cerebral infarction without residual deficits: Secondary | ICD-10-CM | POA: Diagnosis not present

## 2017-07-13 DIAGNOSIS — M6389 Disorders of muscle in diseases classified elsewhere, multiple sites: Secondary | ICD-10-CM | POA: Diagnosis not present

## 2017-07-13 DIAGNOSIS — R41841 Cognitive communication deficit: Secondary | ICD-10-CM | POA: Diagnosis not present

## 2017-07-13 DIAGNOSIS — R1310 Dysphagia, unspecified: Secondary | ICD-10-CM | POA: Diagnosis not present

## 2017-07-13 DIAGNOSIS — Z741 Need for assistance with personal care: Secondary | ICD-10-CM | POA: Diagnosis not present

## 2017-07-13 DIAGNOSIS — R2681 Unsteadiness on feet: Secondary | ICD-10-CM | POA: Diagnosis not present

## 2017-07-14 DIAGNOSIS — Z9181 History of falling: Secondary | ICD-10-CM | POA: Diagnosis not present

## 2017-07-14 DIAGNOSIS — R41841 Cognitive communication deficit: Secondary | ICD-10-CM | POA: Diagnosis not present

## 2017-07-14 DIAGNOSIS — M6389 Disorders of muscle in diseases classified elsewhere, multiple sites: Secondary | ICD-10-CM | POA: Diagnosis not present

## 2017-07-14 DIAGNOSIS — L89153 Pressure ulcer of sacral region, stage 3: Secondary | ICD-10-CM | POA: Diagnosis not present

## 2017-07-14 DIAGNOSIS — R1310 Dysphagia, unspecified: Secondary | ICD-10-CM | POA: Diagnosis not present

## 2017-07-14 DIAGNOSIS — Z741 Need for assistance with personal care: Secondary | ICD-10-CM | POA: Diagnosis not present

## 2017-07-14 DIAGNOSIS — R2681 Unsteadiness on feet: Secondary | ICD-10-CM | POA: Diagnosis not present

## 2017-07-15 ENCOUNTER — Other Ambulatory Visit: Payer: Self-pay | Admitting: *Deleted

## 2017-07-15 DIAGNOSIS — M6389 Disorders of muscle in diseases classified elsewhere, multiple sites: Secondary | ICD-10-CM | POA: Diagnosis not present

## 2017-07-15 DIAGNOSIS — R1310 Dysphagia, unspecified: Secondary | ICD-10-CM | POA: Diagnosis not present

## 2017-07-15 DIAGNOSIS — Z741 Need for assistance with personal care: Secondary | ICD-10-CM | POA: Diagnosis not present

## 2017-07-15 DIAGNOSIS — Z9181 History of falling: Secondary | ICD-10-CM | POA: Diagnosis not present

## 2017-07-15 DIAGNOSIS — R2681 Unsteadiness on feet: Secondary | ICD-10-CM | POA: Diagnosis not present

## 2017-07-15 DIAGNOSIS — R41841 Cognitive communication deficit: Secondary | ICD-10-CM | POA: Diagnosis not present

## 2017-07-15 NOTE — Patient Outreach (Signed)
[facility](THN) Care Management  07/15/2017  Colbert CoyerJohn E Youse May 17, 1940 [id number]   CSW was unable to reach pt's wife by phone today and left a message for callback. CSW was able to speak with SNF rep who reports pt remains there at SNF in a long term care/private pay bed with the likelihood of staying long term. Wife has still not completely decided on long term option, per SNF rep. At this time she has paid through the end of July. SNF staff feel she understands and is   CSW will plan a follow up call or visit to SNF in the next 2 weeks.   Reece LevyJanet Elzia Hott, MSW, LCSW Clinical Social Worker  [facility] [phone]

## 2017-07-16 DIAGNOSIS — Z9181 History of falling: Secondary | ICD-10-CM | POA: Diagnosis not present

## 2017-07-16 DIAGNOSIS — R1310 Dysphagia, unspecified: Secondary | ICD-10-CM | POA: Diagnosis not present

## 2017-07-16 DIAGNOSIS — M6389 Disorders of muscle in diseases classified elsewhere, multiple sites: Secondary | ICD-10-CM | POA: Diagnosis not present

## 2017-07-16 DIAGNOSIS — R2681 Unsteadiness on feet: Secondary | ICD-10-CM | POA: Diagnosis not present

## 2017-07-16 DIAGNOSIS — Z741 Need for assistance with personal care: Secondary | ICD-10-CM | POA: Diagnosis not present

## 2017-07-16 DIAGNOSIS — R41841 Cognitive communication deficit: Secondary | ICD-10-CM | POA: Diagnosis not present

## 2017-07-20 DIAGNOSIS — R41841 Cognitive communication deficit: Secondary | ICD-10-CM | POA: Diagnosis not present

## 2017-07-20 DIAGNOSIS — Z9181 History of falling: Secondary | ICD-10-CM | POA: Diagnosis not present

## 2017-07-20 DIAGNOSIS — R2681 Unsteadiness on feet: Secondary | ICD-10-CM | POA: Diagnosis not present

## 2017-07-20 DIAGNOSIS — R1310 Dysphagia, unspecified: Secondary | ICD-10-CM | POA: Diagnosis not present

## 2017-07-20 DIAGNOSIS — Z741 Need for assistance with personal care: Secondary | ICD-10-CM | POA: Diagnosis not present

## 2017-07-20 DIAGNOSIS — M6389 Disorders of muscle in diseases classified elsewhere, multiple sites: Secondary | ICD-10-CM | POA: Diagnosis not present

## 2017-07-21 DIAGNOSIS — M6389 Disorders of muscle in diseases classified elsewhere, multiple sites: Secondary | ICD-10-CM | POA: Diagnosis not present

## 2017-07-21 DIAGNOSIS — R41841 Cognitive communication deficit: Secondary | ICD-10-CM | POA: Diagnosis not present

## 2017-07-21 DIAGNOSIS — R1310 Dysphagia, unspecified: Secondary | ICD-10-CM | POA: Diagnosis not present

## 2017-07-21 DIAGNOSIS — L89153 Pressure ulcer of sacral region, stage 3: Secondary | ICD-10-CM | POA: Diagnosis not present

## 2017-07-21 DIAGNOSIS — Z741 Need for assistance with personal care: Secondary | ICD-10-CM | POA: Diagnosis not present

## 2017-07-21 DIAGNOSIS — Z9181 History of falling: Secondary | ICD-10-CM | POA: Diagnosis not present

## 2017-07-21 DIAGNOSIS — R2681 Unsteadiness on feet: Secondary | ICD-10-CM | POA: Diagnosis not present

## 2017-07-28 ENCOUNTER — Encounter: Payer: Self-pay | Admitting: *Deleted

## 2017-07-28 ENCOUNTER — Other Ambulatory Visit: Payer: Self-pay | Admitting: *Deleted

## 2017-07-28 DIAGNOSIS — L89153 Pressure ulcer of sacral region, stage 3: Secondary | ICD-10-CM | POA: Diagnosis not present

## 2017-07-28 NOTE — Patient Outreach (Signed)
[facility](THN) Care Management  07/28/2017  Cody CoyerJohn E [name] 1940/10/11 [id number]   CSW was able to confirm with [facility] that pt remains there. Voice message left for SNF dc planner.  Per wife, they stopped his PT due to lack of progression. She is realizing he is too high level of care for her to handle at home and indicates he is there for long term care.   CSW advised wife of plans to close referral.  CSW will advise [facility] and PCP of above.    Cody LevyJanet Jamin [name], MSW, LCSW Clinical Social Worker  [facility] [phone]

## 2017-08-04 DIAGNOSIS — L89153 Pressure ulcer of sacral region, stage 3: Secondary | ICD-10-CM | POA: Diagnosis not present

## 2017-08-18 DIAGNOSIS — L89153 Pressure ulcer of sacral region, stage 3: Secondary | ICD-10-CM | POA: Diagnosis not present

## 2017-08-18 DIAGNOSIS — L988 Other specified disorders of the skin and subcutaneous tissue: Secondary | ICD-10-CM | POA: Diagnosis not present

## 2017-08-25 DIAGNOSIS — L988 Other specified disorders of the skin and subcutaneous tissue: Secondary | ICD-10-CM | POA: Diagnosis not present

## 2017-08-25 DIAGNOSIS — L89153 Pressure ulcer of sacral region, stage 3: Secondary | ICD-10-CM | POA: Diagnosis not present

## 2017-08-27 DIAGNOSIS — R319 Hematuria, unspecified: Secondary | ICD-10-CM | POA: Diagnosis not present

## 2017-08-27 DIAGNOSIS — I4891 Unspecified atrial fibrillation: Secondary | ICD-10-CM | POA: Diagnosis not present

## 2017-08-27 DIAGNOSIS — Z79899 Other long term (current) drug therapy: Secondary | ICD-10-CM | POA: Diagnosis not present

## 2017-08-27 DIAGNOSIS — R05 Cough: Secondary | ICD-10-CM | POA: Diagnosis not present

## 2017-08-27 DIAGNOSIS — N390 Urinary tract infection, site not specified: Secondary | ICD-10-CM | POA: Diagnosis not present

## 2017-09-06 DIAGNOSIS — G309 Alzheimer's disease, unspecified: Secondary | ICD-10-CM | POA: Diagnosis not present

## 2017-09-06 DIAGNOSIS — R2689 Other abnormalities of gait and mobility: Secondary | ICD-10-CM | POA: Diagnosis not present

## 2017-09-30 DIAGNOSIS — R05 Cough: Secondary | ICD-10-CM | POA: Diagnosis not present

## 2017-10-09 DIAGNOSIS — D649 Anemia, unspecified: Secondary | ICD-10-CM | POA: Diagnosis not present

## 2017-10-09 DIAGNOSIS — Z79899 Other long term (current) drug therapy: Secondary | ICD-10-CM | POA: Diagnosis not present

## 2017-10-22 DIAGNOSIS — B351 Tinea unguium: Secondary | ICD-10-CM | POA: Diagnosis not present

## 2017-10-22 DIAGNOSIS — I739 Peripheral vascular disease, unspecified: Secondary | ICD-10-CM | POA: Diagnosis not present

## 2017-10-25 ENCOUNTER — Emergency Department (HOSPITAL_COMMUNITY): Payer: Medicare Other

## 2017-10-25 ENCOUNTER — Encounter (HOSPITAL_COMMUNITY): Payer: Self-pay | Admitting: Emergency Medicine

## 2017-10-25 ENCOUNTER — Other Ambulatory Visit: Payer: Self-pay

## 2017-10-25 ENCOUNTER — Observation Stay (HOSPITAL_COMMUNITY)
Admission: EM | Admit: 2017-10-25 | Discharge: 2017-10-26 | Disposition: A | Discharge status: Skilled Nursing Facility | Payer: Medicare Other | Attending: Internal Medicine | Admitting: Internal Medicine

## 2017-10-25 DIAGNOSIS — Z811 Family history of alcohol abuse and dependence: Secondary | ICD-10-CM | POA: Insufficient documentation

## 2017-10-25 DIAGNOSIS — R000 Tachycardia, unspecified: Secondary | ICD-10-CM | POA: Diagnosis not present

## 2017-10-25 DIAGNOSIS — R7303 Prediabetes: Secondary | ICD-10-CM | POA: Diagnosis not present

## 2017-10-25 DIAGNOSIS — J189 Pneumonia, unspecified organism: Secondary | ICD-10-CM | POA: Diagnosis present

## 2017-10-25 DIAGNOSIS — R0902 Hypoxemia: Secondary | ICD-10-CM | POA: Diagnosis not present

## 2017-10-25 DIAGNOSIS — Z23 Encounter for immunization: Secondary | ICD-10-CM | POA: Diagnosis not present

## 2017-10-25 DIAGNOSIS — J9811 Atelectasis: Secondary | ICD-10-CM | POA: Diagnosis not present

## 2017-10-25 DIAGNOSIS — I700 Atherosclerosis of aorta: Secondary | ICD-10-CM | POA: Diagnosis not present

## 2017-10-25 DIAGNOSIS — R911 Solitary pulmonary nodule: Secondary | ICD-10-CM | POA: Insufficient documentation

## 2017-10-25 DIAGNOSIS — Z8709 Personal history of other diseases of the respiratory system: Secondary | ICD-10-CM | POA: Insufficient documentation

## 2017-10-25 DIAGNOSIS — N400 Enlarged prostate without lower urinary tract symptoms: Secondary | ICD-10-CM | POA: Diagnosis not present

## 2017-10-25 DIAGNOSIS — D72829 Elevated white blood cell count, unspecified: Secondary | ICD-10-CM | POA: Diagnosis not present

## 2017-10-25 DIAGNOSIS — K649 Unspecified hemorrhoids: Secondary | ICD-10-CM | POA: Insufficient documentation

## 2017-10-25 DIAGNOSIS — Z79899 Other long term (current) drug therapy: Secondary | ICD-10-CM | POA: Diagnosis not present

## 2017-10-25 DIAGNOSIS — Z8673 Personal history of transient ischemic attack (TIA), and cerebral infarction without residual deficits: Secondary | ICD-10-CM | POA: Diagnosis not present

## 2017-10-25 DIAGNOSIS — Z87898 Personal history of other specified conditions: Secondary | ICD-10-CM | POA: Diagnosis not present

## 2017-10-25 DIAGNOSIS — I10 Essential (primary) hypertension: Secondary | ICD-10-CM | POA: Insufficient documentation

## 2017-10-25 DIAGNOSIS — Z9849 Cataract extraction status, unspecified eye: Secondary | ICD-10-CM | POA: Insufficient documentation

## 2017-10-25 DIAGNOSIS — Z800 Family history of malignant neoplasm of digestive organs: Secondary | ICD-10-CM | POA: Diagnosis not present

## 2017-10-25 DIAGNOSIS — B351 Tinea unguium: Secondary | ICD-10-CM | POA: Insufficient documentation

## 2017-10-25 DIAGNOSIS — Z823 Family history of stroke: Secondary | ICD-10-CM | POA: Diagnosis not present

## 2017-10-25 DIAGNOSIS — K5790 Diverticulosis of intestine, part unspecified, without perforation or abscess without bleeding: Secondary | ICD-10-CM | POA: Insufficient documentation

## 2017-10-25 DIAGNOSIS — E785 Hyperlipidemia, unspecified: Secondary | ICD-10-CM | POA: Insufficient documentation

## 2017-10-25 DIAGNOSIS — R2681 Unsteadiness on feet: Secondary | ICD-10-CM | POA: Diagnosis not present

## 2017-10-25 DIAGNOSIS — N3010 Interstitial cystitis (chronic) without hematuria: Secondary | ICD-10-CM | POA: Insufficient documentation

## 2017-10-25 DIAGNOSIS — Z7982 Long term (current) use of aspirin: Secondary | ICD-10-CM | POA: Insufficient documentation

## 2017-10-25 DIAGNOSIS — K219 Gastro-esophageal reflux disease without esophagitis: Secondary | ICD-10-CM | POA: Diagnosis not present

## 2017-10-25 HISTORY — DX: Essential (primary) hypertension: I10

## 2017-10-25 LAB — CBC WITH DIFFERENTIAL/PLATELET
Abs Immature Granulocytes: 0.1 10*3/uL — ABNORMAL HIGH (ref 0.00–0.07)
BASOS PCT: 1 %
Basophils Absolute: 0.1 10*3/uL (ref 0.0–0.1)
EOS ABS: 1 10*3/uL — AB (ref 0.0–0.5)
Eosinophils Relative: 7 %
HCT: 48.3 % (ref 39.0–52.0)
Hemoglobin: 15.5 g/dL (ref 13.0–17.0)
IMMATURE GRANULOCYTES: 1 %
Lymphocytes Relative: 15 %
Lymphs Abs: 2.2 10*3/uL (ref 0.7–4.0)
MCH: 28.7 pg (ref 26.0–34.0)
MCHC: 32.1 g/dL (ref 30.0–36.0)
MCV: 89.3 fL (ref 80.0–100.0)
MONOS PCT: 8 %
Monocytes Absolute: 1.2 10*3/uL — ABNORMAL HIGH (ref 0.1–1.0)
NEUTROS PCT: 68 %
Neutro Abs: 9.8 10*3/uL — ABNORMAL HIGH (ref 1.7–7.7)
PLATELETS: 359 10*3/uL (ref 150–400)
RBC: 5.41 MIL/uL (ref 4.22–5.81)
RDW: 13.4 % (ref 11.5–15.5)
WBC: 14.3 10*3/uL — AB (ref 4.0–10.5)
nRBC: 0 % (ref 0.0–0.2)

## 2017-10-25 LAB — I-STAT TROPONIN, ED
TROPONIN I, POC: 0 ng/mL (ref 0.00–0.08)
TROPONIN I, POC: 0.02 ng/mL (ref 0.00–0.08)

## 2017-10-25 LAB — MRSA PCR SCREENING: MRSA BY PCR: NEGATIVE

## 2017-10-25 LAB — PROCALCITONIN

## 2017-10-25 LAB — I-STAT CHEM 8, ED
BUN: 8 mg/dL (ref 8–23)
CALCIUM ION: 1.1 mmol/L — AB (ref 1.15–1.40)
Chloride: 96 mmol/L — ABNORMAL LOW (ref 98–111)
Creatinine, Ser: 0.8 mg/dL (ref 0.61–1.24)
GLUCOSE: 101 mg/dL — AB (ref 70–99)
HCT: 47 % (ref 39.0–52.0)
HEMOGLOBIN: 16 g/dL (ref 13.0–17.0)
POTASSIUM: 3.8 mmol/L (ref 3.5–5.1)
Sodium: 136 mmol/L (ref 135–145)
TCO2: 29 mmol/L (ref 22–32)

## 2017-10-25 LAB — TSH: TSH: 7.721 u[IU]/mL — ABNORMAL HIGH (ref 0.350–4.500)

## 2017-10-25 LAB — D-DIMER, QUANTITATIVE: D-Dimer, Quant: 1.11 ug/mL-FEU — ABNORMAL HIGH (ref 0.00–0.50)

## 2017-10-25 LAB — T4, FREE: FREE T4: 0.96 ng/dL (ref 0.82–1.77)

## 2017-10-25 LAB — STREP PNEUMONIAE URINARY ANTIGEN: STREP PNEUMO URINARY ANTIGEN: NEGATIVE

## 2017-10-25 MED ORDER — DIPHENHYDRAMINE HCL 25 MG PO CAPS: 25.0000 mg | ORAL_CAPSULE | Freq: Two times a day (BID) | ORAL | Status: DC | PRN

## 2017-10-25 MED ORDER — IOPAMIDOL (ISOVUE-370) INJECTION 76%
INTRAVENOUS | Status: AC
  Filled 2017-10-25: qty 100

## 2017-10-25 MED ORDER — ASPIRIN EC 325 MG PO TBEC
325.0000 mg | DELAYED_RELEASE_TABLET | Freq: Every day | ORAL | Status: DC
  Administered 2017-10-25 – 2017-10-26 (×2): 325 mg via ORAL
  Filled 2017-10-25 (×2): qty 1

## 2017-10-25 MED ORDER — SODIUM CHLORIDE 0.9 % IV SOLN
1.0000 g | Freq: Every day | INTRAVENOUS | Status: DC
  Administered 2017-10-25 – 2017-10-26 (×2): 1 g via INTRAVENOUS
  Filled 2017-10-25 (×2): qty 1

## 2017-10-25 MED ORDER — LORATADINE 10 MG PO TABS
10.0000 mg | ORAL_TABLET | Freq: Every day | ORAL | Status: DC
  Administered 2017-10-25 – 2017-10-26 (×2): 10 mg via ORAL
  Filled 2017-10-25 (×2): qty 1

## 2017-10-25 MED ORDER — PIPERACILLIN-TAZOBACTAM 3.375 G IVPB 30 MIN
3.3750 g | Freq: Once | INTRAVENOUS | Status: DC
  Filled 2017-10-25: qty 50

## 2017-10-25 MED ORDER — ADULT MULTIVITAMIN W/MINERALS CH
1.0000 | ORAL_TABLET | Freq: Every evening | ORAL | Status: DC
  Administered 2017-10-25: 1 via ORAL
  Filled 2017-10-25: qty 1

## 2017-10-25 MED ORDER — TAMSULOSIN HCL 0.4 MG PO CAPS
0.4000 mg | ORAL_CAPSULE | Freq: Every day | ORAL | Status: DC
  Administered 2017-10-25 – 2017-10-26 (×2): 0.4 mg via ORAL
  Filled 2017-10-25 (×2): qty 1

## 2017-10-25 MED ORDER — ADULT ONE DAILY GUMMIES PO CHEW: 2.0000 | CHEWABLE_TABLET | Freq: Every evening | ORAL | Status: DC

## 2017-10-25 MED ORDER — AZITHROMYCIN 250 MG PO TABS
500.0000 mg | ORAL_TABLET | Freq: Every day | ORAL | Status: DC
  Administered 2017-10-25 – 2017-10-26 (×2): 500 mg via ORAL
  Filled 2017-10-25 (×3): qty 2

## 2017-10-25 MED ORDER — INFLUENZA VAC SPLIT HIGH-DOSE 0.5 ML IM SUSY
0.5000 mL | PREFILLED_SYRINGE | INTRAMUSCULAR | Status: AC
  Administered 2017-10-26: 0.5 mL via INTRAMUSCULAR
  Filled 2017-10-25: qty 0.5

## 2017-10-25 MED ORDER — VANCOMYCIN HCL IN DEXTROSE 1-5 GM/200ML-% IV SOLN
1000.0000 mg | Freq: Once | INTRAVENOUS | Status: DC
  Filled 2017-10-25: qty 200

## 2017-10-25 MED ORDER — SODIUM CHLORIDE 0.9 % IV SOLN
INTRAVENOUS | Status: DC
  Administered 2017-10-25 (×2): via INTRAVENOUS

## 2017-10-25 MED ORDER — ENOXAPARIN SODIUM 40 MG/0.4ML ~~LOC~~ SOLN: 40.0000 mg | SUBCUTANEOUS | Status: DC

## 2017-10-25 MED ORDER — SODIUM CHLORIDE 0.9 % IJ SOLN
INTRAMUSCULAR | Status: AC
  Filled 2017-10-25: qty 50

## 2017-10-25 MED ORDER — GUAIFENESIN 100 MG/5ML PO SOLN
300.0000 mg | Freq: Three times a day (TID) | ORAL | Status: DC | PRN
  Administered 2017-10-25 – 2017-10-26 (×3): 300 mg via ORAL
  Filled 2017-10-25 (×4): qty 10

## 2017-10-25 MED ORDER — IOPAMIDOL (ISOVUE-370) INJECTION 76%
100.0000 mL | Freq: Once | INTRAVENOUS | Status: AC | PRN
  Administered 2017-10-25: 100 mL via INTRAVENOUS

## 2017-10-25 MED ORDER — METOPROLOL SUCCINATE ER 25 MG PO TB24
25.0000 mg | ORAL_TABLET | Freq: Every day | ORAL | Status: DC
  Administered 2017-10-25 – 2017-10-26 (×2): 25 mg via ORAL
  Filled 2017-10-25 (×2): qty 1

## 2017-10-25 MED ORDER — AMLODIPINE BESYLATE 5 MG PO TABS
2.5000 mg | ORAL_TABLET | Freq: Every day | ORAL | Status: DC
  Administered 2017-10-25 – 2017-10-26 (×2): 2.5 mg via ORAL
  Filled 2017-10-25 (×2): qty 1

## 2017-10-25 MED ORDER — ATORVASTATIN CALCIUM 40 MG PO TABS
80.0000 mg | ORAL_TABLET | Freq: Every day | ORAL | Status: DC
  Administered 2017-10-25: 80 mg via ORAL
  Filled 2017-10-25: qty 2

## 2017-10-25 MED ORDER — PANTOPRAZOLE SODIUM 40 MG PO TBEC
40.0000 mg | DELAYED_RELEASE_TABLET | Freq: Every day | ORAL | Status: DC
  Administered 2017-10-25 – 2017-10-26 (×2): 40 mg via ORAL
  Filled 2017-10-25 (×2): qty 1

## 2017-10-25 MED ORDER — PENTOSAN POLYSULFATE SODIUM 100 MG PO CAPS
200.0000 mg | ORAL_CAPSULE | Freq: Every day | ORAL | Status: DC
  Administered 2017-10-25 – 2017-10-26 (×2): 200 mg via ORAL
  Filled 2017-10-25 (×2): qty 2

## 2017-10-25 NOTE — ED Notes (Signed)
EKG given to EDP,Palumbo,MD., for review. 

## 2017-10-25 NOTE — ED Notes (Signed)
Bed: RESB Expected date:  Expected time:  Means of arrival:  Comments: 77yo M/tachycardic

## 2017-10-25 NOTE — ED Provider Notes (Signed)
[facility]ENC[facility] DEPT Provider Note   CSN: [id number] Arrival date & time: 10/25/17  0217     History   Chief Complaint Chief Complaint  Patient presents with  . Tachycardia    HPI Cody [name] is a 77 y.o. male.  The history is provided by the patient.  Illness  This is a recurrent problem. The current episode started 3 to 5 hours ago. The problem occurs constantly. The problem has been resolved. Pertinent negatives include no chest pain, no abdominal pain and no headaches. Nothing aggravates the symptoms. Relieved by: being given his beta bloacker at the nursing home. Treatments tried: metoprolol. The treatment provided significant relief.  Patient with a h/o HTN presents for nursing home with tachycardia.  Patient reports being given his metoprolol late and once he was given it, he was back to normal.  No leg pain.  No f/c/r. No CP.    Past Medical History:  Diagnosis Date  . Hyperlipidemia   . Hypertension   . Interstitial cystitis   . Scarlet fever 11/09/2007   Annotation: as a child, no sequellae Qualifier: History of  By: Debby Bud MD, Rosalyn Gess   . Stroke (HCC)   . WHOOPING COUGH 11/10/2007   Qualifier: History of  By: Debby Bud MD, Rosalyn Gess     Patient Active Problem List   Diagnosis Date Noted  . GERD (gastroesophageal reflux disease) 05/12/2017  . Hypertension 04/19/2016  . Onychomycosis 04/19/2016  . History of stroke without residual deficits 03/31/2016  . Interstitial cystitis 03/31/2016  . Physical deconditioning 02/10/2015  . Prediabetes 02/09/2015  . Hyperlipidemia 04/09/2007  . HEMORRHOIDS 04/09/2007  . DIVERTICULOSIS, COLON 04/09/2007  . BPH (benign prostatic hyperplasia) 04/09/2007    Past Surgical History:  Procedure Laterality Date  . CATARACT EXTRACTION    . CYSTOSTOMY W/ BLADDER BIOPSY     2  . INGUINAL HERNIA REPAIR Bilateral   . KNEE ARTHROSCOPY Left   . TONSILLECTOMY  1948        Home Medications     Prior to Admission medications   Medication Sig Start Date End Date Taking? Authorizing Provider  amLODipine (NORVASC) 2.5 MG tablet Take 1 tablet (2.5 mg total) by mouth daily. 05/12/17   Pincus Sanes, MD  aspirin 325 MG EC tablet Take 1 tablet (325 mg total) by mouth daily. 04/18/16   Pincus Sanes, MD  atorvastatin (LIPITOR) 80 MG tablet Take 1 tablet (80 mg total) by mouth daily at 6 PM. 05/12/17   Burns, Bobette Mo, MD  docusate sodium (COLACE) 100 MG capsule Take 100 mg by mouth every evening.    [provider]  hydrOXYzine (ATARAX/VISTARIL) 25 MG tablet Take 12.5 mg by mouth every other day.    [provider]  loratadine (CLARITIN) 10 MG tablet Take 10 mg by mouth daily.    [provider]  Multiple Vitamins-Minerals (ADULT ONE DAILY GUMMIES) CHEW Chew 2 each by mouth every evening.     [provider]  omeprazole (PRILOSEC) 20 MG capsule Take 1 capsule (20 mg total) by mouth daily before breakfast. 05/12/17   Burns, Bobette Mo, MD  pentosan polysulfate (ELMIRON) 100 MG capsule Take 2 capsules (200 mg total) by mouth daily. Patient taking differently: Take 100 mg by mouth every evening.  08/12/12   Norins, Rosalyn Gess, MD  solifenacin (VESICARE) 10 MG tablet Take 10 mg by mouth every evening.    [provider]  tamsulosin (FLOMAX) 0.4 MG CAPS Take  1 capsule (0.4 mg total) by mouth daily. Patient taking differently: Take 0.4 mg by mouth daily after supper.  08/12/12   Norins, Rosalyn Gess, MD    Family History Family History  Problem Relation Age of Onset  . Colon cancer Mother   . Alcohol abuse Father   . Stroke Sister     Social History Social History   Tobacco Use  . Smoking status: Never Smoker  . Smokeless tobacco: Never Used  Substance Use Topics  . Alcohol use: No  . Drug use: No     Allergies   Patient has no known allergies.   Review of Systems Review of Systems  Constitutional: Negative for diaphoresis and fatigue.   HENT: Negative for congestion.   Eyes: Negative for photophobia.  Respiratory: Positive for cough. Negative for wheezing.   Cardiovascular: Negative for chest pain and leg swelling.  Gastrointestinal: Negative for abdominal pain and vomiting.  Genitourinary: Negative for dysuria.  Musculoskeletal: Negative for back pain.  Neurological: Negative for headaches.  Psychiatric/Behavioral: Negative for confusion.     Physical Exam Updated Vital Signs BP (!) 158/84 (BP Location: Left Arm)   Pulse 98   Temp 97.8 F (36.6 C) (Oral)   SpO2 96%   Physical Exam  Constitutional: He appears well-developed and well-nourished.  HENT:  Head: Normocephalic and atraumatic.  Mouth/Throat: No oropharyngeal exudate.  Eyes: Pupils are equal, round, and reactive to light. Conjunctivae are normal.  Neck: Normal range of motion. Neck supple.  Cardiovascular: Normal rate, regular rhythm, normal heart sounds and intact distal pulses.  Pulmonary/Chest: Effort normal and breath sounds normal. No respiratory distress. He has no wheezes.  Abdominal: Soft. Bowel sounds are normal. He exhibits no mass. There is no tenderness. There is no rebound and no guarding.  Musculoskeletal: Normal range of motion. He exhibits no tenderness.  Neurological: He is alert. He displays normal reflexes.  Skin: Skin is warm and dry. Capillary refill takes less than 2 seconds. He is not diaphoretic.  Psychiatric: He has a normal mood and affect.     ED Treatments / Results  Labs (all labs ordered are listed, but only abnormal results are displayed) Results for orders placed or performed during the hospital encounter of 10/25/17  CBC with Differential/Platelet  Result Value Ref Range   WBC 14.3 (H) 4.0 - 10.5 K/uL   RBC 5.41 4.22 - 5.81 MIL/uL   Hemoglobin 15.5 13.0 - 17.0 g/dL   HCT 65.7 84.6 - 96.2 %   MCV 89.3 80.0 - 100.0 fL   MCH 28.7 26.0 - 34.0 pg   MCHC 32.1 30.0 - 36.0 g/dL   RDW 95.2 84.1 - 32.4 %    Platelets 359 150 - 400 K/uL   nRBC 0.0 0.0 - 0.2 %   Neutrophils Relative % 68 %   Neutro Abs 9.8 (H) 1.7 - 7.7 K/uL   Lymphocytes Relative 15 %   Lymphs Abs 2.2 0.7 - 4.0 K/uL   Monocytes Relative 8 %   Monocytes Absolute 1.2 (H) 0.1 - 1.0 K/uL   Eosinophils Relative 7 %   Eosinophils Absolute 1.0 (H) 0.0 - 0.5 K/uL   Basophils Relative 1 %   Basophils Absolute 0.1 0.0 - 0.1 K/uL   Immature Granulocytes 1 %   Abs Immature Granulocytes 0.10 (H) 0.00 - 0.07 K/uL  D-dimer, quantitative (not at [facility])  Result Value Ref Range   D-Dimer, Quant 1.11 (H) 0.00 - 0.50 ug/mL-FEU  I-stat chem 8, ed  Result  Value Ref Range   Sodium 136 135 - 145 mmol/L   Potassium 3.8 3.5 - 5.1 mmol/L   Chloride 96 (L) 98 - 111 mmol/L   BUN 8 8 - 23 mg/dL   Creatinine, Ser 9.60 0.61 - 1.24 mg/dL   Glucose, Bld 454 (H) 70 - 99 mg/dL   Calcium, Ion 0.98 (L) 1.15 - 1.40 mmol/L   TCO2 29 22 - 32 mmol/L   Hemoglobin 16.0 13.0 - 17.0 g/dL   HCT 11.9 14.7 - 82.9 %  I-Stat Troponin, ED (not at [facility])  Result Value Ref Range   Troponin i, poc 0.00 0.00 - 0.08 ng/mL   Comment 3           Ct Angio Chest Pe W And/or Wo Contrast  Result Date: 10/25/2017 CLINICAL DATA:  Cough for 1 month. Tachycardia. EXAM: CT ANGIOGRAPHY CHEST WITH CONTRAST TECHNIQUE: Multidetector CT imaging of the chest was performed using the standard protocol during bolus administration of intravenous contrast. Multiplanar CT image reconstructions and MIPs were obtained to evaluate the vascular anatomy. CONTRAST:  100 mL Isovue 370 COMPARISON:  None. FINDINGS: Cardiovascular: Good opacification of the central and segmental pulmonary arteries. No evidence of significant pulmonary embolus. Normal heart size. No pericardial effusion. Normal caliber thoracic aorta with scattered calcifications. Great vessel origins are patent. Mediastinum/Nodes: Esophagus is decompressed. No significant lymphadenopathy in the chest. Lungs/Pleura: Motion artifact limits  examination. Shallow inspiration. Atelectasis with patchy areas of infiltration/consolidation in both lung bases. Fluid in the right major fissure. No pneumothorax. No significant effusions. Right middle lung nodule measuring 7 mm in diameter. Upper Abdomen: No acute process demonstrated in the upper abdomen. Colonic interposition under the right hemidiaphragm anterior to the liver. Musculoskeletal: Degenerative changes in the spine. No destructive bone lesions. Review of the MIP images confirms the above findings. IMPRESSION: 1. No evidence of significant pulmonary embolus. 2. Patchy areas of atelectasis or consolidation in both lung bases. Fluid in the right major fissure. 3. 7 mm nodule in the right middle lung. Non-contrast chest CT at 6-12 months is recommended. If the nodule is stable at time of repeat CT, then future CT at 18-24 months (from today's scan) is considered optional for low-risk patients, but is recommended for high-risk patients. This recommendation follows the consensus statement: Guidelines for Management of Incidental Pulmonary Nodules Detected on CT Images: From the Fleischner Society 2017; Radiology 2017; 284:228-243. Aortic Atherosclerosis (ICD10-I70.0). Electronically Signed   By: Burman Nieves M.D.   On: 10/25/2017 05:47   Dg Chest Portable 1 View  Result Date: 10/25/2017 CLINICAL DATA:  Tachycardia EXAM: PORTABLE CHEST 1 VIEW COMPARISON:  None. FINDINGS: There is shallow lung inflation with bibasilar atelectasis. Cardiomediastinal contours are normal. There is no pleural effusion or pneumothorax. Right hemidiaphragm remains elevated. IMPRESSION: Chronic elevation of the right hemidiaphragm. Bibasilar atelectasis. Electronically Signed   By: Deatra Robinson M.D.   On: 10/25/2017 03:43    EKG  EKG Interpretation  Date/Time:  Sunday October 25 2017 02:26:37 EDT Ventricular Rate:  90 PR Interval:    QRS Duration: 82 QT Interval:  347 QTC Calculation: 425 R Axis:   -26 Text  Interpretation:  Sinus rhythm Borderline left axis deviation Confirmed by Ziare Orrick ([id number]) on 10/25/2017 3:42:00 AM        Procedures Procedures (including critical care time)  Medications  sodium chloride 0.9 % injection (has no administration in time range)  vancomycin (VANCOCIN) IVPB 1000 mg/200 mL premix (has no administration in time range)  piperacillin-tazobactam (ZOSYN) IVPB 3.375 g (has no administration in time range)  iopamidol (ISOVUE-370) 76 % injection 100 mL (100 mLs Intravenous Contrast Given 10/25/17 0453)    Final Clinical Impressions(s) / ED Diagnoses   Patient with tachycardia that resolved and cough.  No fever in the ED but elevated DDimer.  CT angio ordered showing HCAP, patient was cultured and vancomycin and zosyn ordered.        Maily Debarge, MD 10/25/17 [phone]

## 2017-10-25 NOTE — Progress Notes (Signed)
Per Dr. Lowell Guitar, pt to be transferred to telemetry. Has a bed, room 1441, currently preparing for transfer. All belongings packed, report called to Maralyn Sago, admission completed, antibiotics held per Dr. Roanna Banning request, patient cleaned up and complete bed change done just now due to episode of bowel and bladder incontinence. Fluids infusing, lungs diminished with scattered rhonchi, productive cough with yellow sputum, ble non-pitting edema, excoriation to buttocks and scrotum, documented in skin assessment, vss, denies pain, will be transported by 2 nurse techs.

## 2017-10-25 NOTE — H&P (Signed)
History and Physical    Cody [name]:[id number] DOB: 06-07-1940 DOA: 10/25/2017  PCP: Pincus Sanes, MD  Patient coming from: [facility]  I have personally briefly reviewed patient's old medical records in [facility]  Chief Complaint: tachycardia  HPI: Cody [name] is a 77 y.o. male with medical history significant of CVA, HTN, HLD, and multiple other medical problems presenting with tachycardia from [facility].  Mr. Barthelemy told me he was sent here for a fast HR.  He denies palpitations, chest pain, shortness of breath, fevers, chills, abdominal pain, nausea, vomiting.  He does say that he has had a cough for about 4 weeks, that he attributes to allergies.    ED Course: Labs, imaging, abx.  Admit for concern for pneumonia.  Review of Systems: As per HPI otherwise 10 point review of systems negative.   Past Medical History:  Diagnosis Date  . Hyperlipidemia   . Hypertension   . Interstitial cystitis   . Scarlet fever 11/09/2007   Annotation: as a child, no sequellae Qualifier: History of  By: Debby Bud MD, Rosalyn Gess   . Stroke (HCC)   . WHOOPING COUGH 11/10/2007   Qualifier: History of  By: Debby Bud MD, Rosalyn Gess     Past Surgical History:  Procedure Laterality Date  . CATARACT EXTRACTION    . CYSTOSTOMY W/ BLADDER BIOPSY     2  . INGUINAL HERNIA REPAIR Bilateral   . KNEE ARTHROSCOPY Left   . TONSILLECTOMY  1948     reports that he has never smoked. He has never used smokeless tobacco. He reports that he does not drink alcohol or use drugs.  No Known Allergies  Family History  Problem Relation Age of Onset  . Colon cancer Mother   . Alcohol abuse Father   . Stroke Sister    Prior to Admission medications   Medication Sig Start Date End Date Taking? Authorizing Provider  amLODipine (NORVASC) 2.5 MG tablet Take 1 tablet (2.5 mg total) by mouth daily. 05/12/17  Yes Burns, Bobette Mo, MD  aspirin 325 MG EC tablet Take 1 tablet (325 mg total) by mouth daily. 04/18/16   Yes Burns, Bobette Mo, MD  atorvastatin (LIPITOR) 80 MG tablet Take 1 tablet (80 mg total) by mouth daily at 6 PM. 05/12/17  Yes Burns, Bobette Mo, MD  diphenhydrAMINE (BENADRYL) 25 mg capsule Take 25 mg by mouth every 12 (twelve) hours as needed for itching.   Yes [provider]  guaifenesin (ROBITUSSIN) 100 MG/5ML syrup Take 300 mg by mouth every 8 (eight) hours as needed for cough or congestion.   Yes [provider]  loratadine (CLARITIN) 10 MG tablet Take 10 mg by mouth daily.   Yes [provider]  meloxicam (MOBIC) 15 MG tablet Take 15 mg by mouth daily as needed for pain. 10/01/17  Yes [provider]  metoprolol succinate (TOPROL-XL) 25 MG 24 hr tablet Take 25 mg by mouth daily. 10/01/17  Yes [provider]  Multiple Vitamins-Minerals (ADULT ONE DAILY GUMMIES) CHEW Chew 2 each by mouth every evening.    Yes [provider]  omeprazole (PRILOSEC) 20 MG capsule Take 1 capsule (20 mg total) by mouth daily before breakfast. 05/12/17  Yes Burns, Bobette Mo, MD  pentosan polysulfate (ELMIRON) 100 MG capsule Take 2 capsules (200 mg total) by mouth daily. 08/12/12  Yes Norins, Rosalyn Gess, MD  tamsulosin (FLOMAX) 0.4 MG CAPS Take 1 capsule (0.4 mg total) by mouth daily. 08/12/12  Yes Norins, Rosalyn Gess, MD  zinc oxide (BALMEX) 11.3 % CREA cream Apply 1 application topically 2 (two) times daily.   Yes [provider]    Physical Exam: Vitals:   10/25/17 0218 10/25/17 0222 10/25/17 0507 10/25/17 0658  BP:  (!) 158/84 (!) 168/83 (!) 141/70  Pulse:  98 88 78  Resp:   20 (!) 21  Temp:  97.8 F (36.6 C)  97.8 F (36.6 C)  TempSrc:  Oral    SpO2: 96% 96% 95% 95%    Constitutional: NAD, calm, comfortable, pleasant Vitals:   10/25/17 0218 10/25/17 0222 10/25/17 0507 10/25/17 0658  BP:  (!) 158/84 (!) 168/83 (!) 141/70  Pulse:  98 88 78  Resp:   20 (!) 21  Temp:  97.8 F (36.6 C)  97.8 F (36.6 C)  TempSrc:  Oral    SpO2: 96% 96% 95% 95%    Eyes: PERRL, lids and conjunctivae normal ENMT: Mucous membranes are moist. Posterior pharynx clear of any exudate or lesions.Normal dentition.  Neck: normal, supple, no masses, no thyromegaly Respiratory: clear to auscultation bilaterally, no wheezing, no crackles. Normal respiratory effort. No accessory muscle use.  Cardiovascular: Regular rate and rhythm, no murmurs / rubs / gallops. No extremity edema. 2+ pedal pulses. No carotid bruits.  Abdomen: no tenderness, no masses palpated. No hepatosplenomegaly. Bowel sounds positive.  Musculoskeletal: no clubbing / cyanosis. No joint deformity upper and lower extremities. Good ROM, no contractures. Normal muscle tone.  Skin: no rashes, lesions, ulcers. No induration Neurologic: CN 2-12 grossly intact. Sensation intact. Strength 5/5 in all 4.  Psychiatric: Normal judgment and insight. Alert and oriented x 2. Normal mood.   Labs on Admission: I have personally reviewed following labs and imaging studies  CBC: Recent Labs  Lab 10/25/17 0247 10/25/17 0256  WBC 14.3*  --   NEUTROABS 9.8*  --   HGB 15.5 16.0  HCT 48.3 47.0  MCV 89.3  --   PLT 359  --    Basic Metabolic Panel: Recent Labs  Lab 10/25/17 0256  NA 136  K 3.8  CL 96*  GLUCOSE 101*  BUN 8  CREATININE 0.80   GFR: CrCl cannot be calculated (Unknown ideal weight.). Liver Function Tests: No results for input(s): AST, ALT, ALKPHOS, BILITOT, PROT, ALBUMIN in the last 168 hours. No results for input(s): LIPASE, AMYLASE in the last 168 hours. No results for input(s): AMMONIA in the last 168 hours. Coagulation Profile: No results for input(s): INR, PROTIME in the last 168 hours. Cardiac Enzymes: No results for input(s): CKTOTAL, CKMB, CKMBINDEX, TROPONINI in the last 168 hours. BNP (last 3 results) No results for input(s): PROBNP in the last 8760 hours. HbA1C: No results for input(s): HGBA1C in the last 72 hours. CBG: No results for input(s): GLUCAP in the last 168  hours. Lipid Profile: No results for input(s): CHOL, HDL, LDLCALC, TRIG, CHOLHDL, LDLDIRECT in the last 72 hours. Thyroid Function Tests: No results for input(s): TSH, T4TOTAL, FREET4, T3FREE, THYROIDAB in the last 72 hours. Anemia Panel: No results for input(s): VITAMINB12, FOLATE, FERRITIN, TIBC, IRON, RETICCTPCT in the last 72 hours. Urine analysis:    Component Value Date/Time   COLORURINE YELLOW 06/08/2017 1309   APPEARANCEUR CLEAR 06/08/2017 1309   LABSPEC 1.014 06/08/2017 1309   PHURINE 6.0 06/08/2017 1309   GLUCOSEU NEGATIVE 06/08/2017 1309   GLUCOSEU NEGATIVE 10/06/2006 1617   HGBUR NEGATIVE 06/08/2017 1309   BILIRUBINUR NEGATIVE 06/08/2017 1309   KETONESUR 5 (A) 06/08/2017 1309  PROTEINUR NEGATIVE 06/08/2017 1309   UROBILINOGEN 0.2 mg/dL 16/10/9602 5409   NITRITE NEGATIVE 06/08/2017 1309   LEUKOCYTESUR TRACE (A) 06/08/2017 1309    Radiological Exams on Admission: Ct Angio Chest Pe W And/or Wo Contrast  Result Date: 10/25/2017 CLINICAL DATA:  Cough for 1 month. Tachycardia. EXAM: CT ANGIOGRAPHY CHEST WITH CONTRAST TECHNIQUE: Multidetector CT imaging of the chest was performed using the standard protocol during bolus administration of intravenous contrast. Multiplanar CT image reconstructions and MIPs were obtained to evaluate the vascular anatomy. CONTRAST:  100 mL Isovue 370 COMPARISON:  None. FINDINGS: Cardiovascular: Good opacification of the central and segmental pulmonary arteries. No evidence of significant pulmonary embolus. Normal heart size. No pericardial effusion. Normal caliber thoracic aorta with scattered calcifications. Great vessel origins are patent. Mediastinum/Nodes: Esophagus is decompressed. No significant lymphadenopathy in the chest. Lungs/Pleura: Motion artifact limits examination. Shallow inspiration. Atelectasis with patchy areas of infiltration/consolidation in both lung bases. Fluid in the right major fissure. No pneumothorax. No significant  effusions. Right middle lung nodule measuring 7 mm in diameter. Upper Abdomen: No acute process demonstrated in the upper abdomen. Colonic interposition under the right hemidiaphragm anterior to the liver. Musculoskeletal: Degenerative changes in the spine. No destructive bone lesions. Review of the MIP images confirms the above findings. IMPRESSION: 1. No evidence of significant pulmonary embolus. 2. Patchy areas of atelectasis or consolidation in both lung bases. Fluid in the right major fissure. 3. 7 mm nodule in the right middle lung. Non-contrast chest CT at 6-12 months is recommended. If the nodule is stable at time of repeat CT, then future CT at 18-24 months (from today's scan) is considered optional for low-risk patients, but is recommended for high-risk patients. This recommendation follows the consensus statement: Guidelines for Management of Incidental Pulmonary Nodules Detected on CT Images: From the Fleischner Society 2017; Radiology 2017; 284:228-243. Aortic Atherosclerosis (ICD10-I70.0). Electronically Signed   By: Burman Nieves M.D.   On: 10/25/2017 05:47   Dg Chest Portable 1 View  Result Date: 10/25/2017 CLINICAL DATA:  Tachycardia EXAM: PORTABLE CHEST 1 VIEW COMPARISON:  None. FINDINGS: There is shallow lung inflation with bibasilar atelectasis. Cardiomediastinal contours are normal. There is no pleural effusion or pneumothorax. Right hemidiaphragm remains elevated. IMPRESSION: Chronic elevation of the right hemidiaphragm. Bibasilar atelectasis. Electronically Signed   By: Deatra Robinson M.D.   On: 10/25/2017 03:43    EKG: Independently reviewed. NSR.  Appears similar to priors.   Assessment/Plan Active Problems:   HCAP (healthcare-associated pneumonia)   Tachycardia  Tachycardia: this was the reason for his transfer to the ED.  He was asymptomatic, but had HR to the 130's which was reportedly irregular on my discussion with [facility] staff.  This had resolved by the time he got  into the ED.  Had positive d dimer, but negative CTA for PE. Continue to monitor on tele Follow TSH Continue metop Consider outpatient event monitor  Concern for pneumonia: he had atelectasis vs consolidation in both lung bases as well as fluid in the R major fissure.  He does not clinically look like he has pneumonia and was asymptomatic at the time of transfer.  He has notably had a cough for the past few weeks. Follow procalcitonin Will start treatment for CAP, though consider d/c if normal PCT Follow blood cx  Leukocytosis: possibly 2/2 above  Hypertension: amlodipine, metoprolol  BPH: flomax  CVA: ASA, lipitor  Interstitial Cystitis: continue elmiron  7 mm RML nodule: follow up outpatient.  Repeat CT in 6-12  months.  DVT prophylaxis: SCD  Code Status: DNR, confirmed with pt, [facility], and wife  Family Communication: wife over phone  Disposition Plan: pending improvement, likely d/c tomorrow Consults called: none  Admission status: observation   Lacretia Nicks MD [facility] Pager [phone]  If 7PM-7AM, please contact night-coverage [email or web address] Password [email or web address]  10/25/2017, 10:34 AM

## 2017-10-25 NOTE — ED Notes (Signed)
Patient transported to CT 

## 2017-10-26 DIAGNOSIS — J189 Pneumonia, unspecified organism: Secondary | ICD-10-CM

## 2017-10-26 DIAGNOSIS — R000 Tachycardia, unspecified: Secondary | ICD-10-CM

## 2017-10-26 DIAGNOSIS — R911 Solitary pulmonary nodule: Secondary | ICD-10-CM

## 2017-10-26 LAB — COMPREHENSIVE METABOLIC PANEL
ALBUMIN: 2.8 g/dL — AB (ref 3.5–5.0)
ALT: 17 U/L (ref 0–44)
ANION GAP: 9 (ref 5–15)
AST: 20 U/L (ref 15–41)
Alkaline Phosphatase: 81 U/L (ref 38–126)
BILIRUBIN TOTAL: 0.4 mg/dL (ref 0.3–1.2)
BUN: 8 mg/dL (ref 8–23)
CO2: 27 mmol/L (ref 22–32)
Calcium: 8.1 mg/dL — ABNORMAL LOW (ref 8.9–10.3)
Chloride: 107 mmol/L (ref 98–111)
Creatinine, Ser: 0.71 mg/dL (ref 0.61–1.24)
GFR calc non Af Amer: >60 mL/min (ref >60)
Glucose, Bld: 93 mg/dL (ref 70–99)
POTASSIUM: 3.9 mmol/L (ref 3.5–5.1)
Sodium: 143 mmol/L (ref 135–145)
Total Protein: 5.8 g/dL — ABNORMAL LOW (ref 6.5–8.1)

## 2017-10-26 LAB — CBC
HEMATOCRIT: 42.7 % (ref 39.0–52.0)
Hemoglobin: 13.5 g/dL (ref 13.0–17.0)
MCH: 28.8 pg (ref 26.0–34.0)
MCHC: 31.6 g/dL (ref 30.0–36.0)
MCV: 91 fL (ref 80.0–100.0)
Platelets: 278 10*3/uL (ref 150–400)
RBC: 4.69 MIL/uL (ref 4.22–5.81)
RDW: 13.9 % (ref 11.5–15.5)
WBC: 10.5 10*3/uL (ref 4.0–10.5)
nRBC: 0 % (ref 0.0–0.2)

## 2017-10-26 LAB — HIV ANTIBODY (ROUTINE TESTING W REFLEX): HIV Screen 4th Generation wRfx: NONREACTIVE

## 2017-10-26 LAB — MAGNESIUM: Magnesium: 2 mg/dL (ref 1.7–2.4)

## 2017-10-26 LAB — PROCALCITONIN: Procalcitonin: <0.1 ng/mL

## 2017-10-26 MED ORDER — AMOXICILLIN-POT CLAVULANATE 875-125 MG PO TABS: 1.0000 | ORAL_TABLET | Freq: Two times a day (BID) | ORAL | 0 refills | Status: AC

## 2017-10-26 NOTE — Clinical Social Work Note (Signed)
Pt returning to [facility] report [phone]. Will arrange [facility] transportation  Clinical Social Work Assessment  Patient Details  Name: Cody [name] MRN: [id number] Date of Birth: October 28, 1940  Date of referral:  10/26/17               Reason for consult:  (admitted from facility)                Permission sought to share information with:  Family Supports, Chartered certified accountant granted to share information::  Yes, Verbal Permission Granted  Name::     Cody [name] Journalist, newspaper::  [employer]  Relationship::     Contact Information:     Housing/Transportation Living arrangements for the past 2 months:  [place] of Information:  Spouse, Facility, Patient Patient Interpreter Needed:  None Criminal Activity/Legal Involvement Pertinent to Current Situation/Hospitalization:  No - Comment as needed Significant Relationships:  Spouse, Warehouse manager Lives with:  Facility Resident Do you feel safe going back to the place where you live?  Yes Need for family participation in patient care:  Yes (Comment)(Cody [name] involved)  Care giving concerns:  Pt admitted from [facility]. Is long term care resident there. Admitted with tachycardia and pneumonia. Needs assistance ambulating at baseline  Facilities manager / plan:  CSW consulted to assist with DC- pt is resident of [facility] and returning at DC. Met with pt at bedside- he was alert and oriented, welcoming of CSW interaction. Also spoke with pt's Cody [name] via phone to involve her in DC plan. Pt and Cody [name] report plan to return to [facility] at DC. Pt notes "I would rather go home." CSW inquired about this and pt reports that "a couple of the staff members order me around." Cody [name] states that "it is certain staff members he is having a little problem with. We are working it out." Pt confirms "I am talking with them there about it. It just doesn't feel good  to be ordered around." CSW validated his expressions.  Spoke with Andee Poles at [facility] and provided DC information. Will arrange transportation there. Cody [name] coming to gather patient's belongings.   Employment status:  Retired Forensic scientist:  Medicare PT Recommendations:    Information / Referral to community resources:     Patient/Family's Response to care:  appreciative  Patient/Family's Understanding of and Emotional Response to Diagnosis, Current Treatment, and Prognosis: see above  Emotional Assessment Appearance:  Appears stated age Attitude/Demeanor/Rapport:  Engaged Affect (typically observed):  Accepting, Calm Orientation:  Oriented to Self, Oriented to Place, Oriented to  Time, Oriented to Situation Alcohol / Substance use:  Not Applicable Psych involvement (Current and /or in the community):  No (Comment)  Discharge Needs  Concerns to be addressed:  Discharge Planning Concerns Readmission within the last 30 days:  No Current discharge risk:  None Barriers to Discharge:  No Barriers Identified   Nila Nephew, LCSW 10/26/2017, 10:59 AM [phone]

## 2017-10-26 NOTE — Discharge Summary (Signed)
Physician Discharge Summary  Cody [name] ZOX:[id number] DOB: Feb 26, 1940 DOA: 10/25/2017  PCP: Pincus Sanes, MD  Admit date: 10/25/2017 Discharge date: 10/26/2017  Admitted From: Nursing home Disposition: Nursing home Recommendations for Outpatient Follow-up:  1. Follow up with PCP in 1-2 weeks 2. Please obtain BMP/CBC in one week  Home Health none Equipment/Devices: None  discharge Condition: Stable CODE STATUS: Full code Diet recommendation cardiac  Brief/Interim Summary: 77 y.o. male with medical history significant of CVA, HTN, HLD, and multiple other medical problems presenting with tachycardia from [facility].  Cody [name] told me he was sent here for a fast HR.  He denies palpitations, chest pain, shortness of breath, fevers, chills, abdominal pain, nausea, vomiting.  He does say that he has had a cough for about 4 weeks, that he attributes to allergies.    ED Course: Labs, imaging, abx.  Admit for concern for pneumonia Discharge Diagnoses:  Active Problems:   HCAP (healthcare-associated pneumonia)   Tachycardia  #1 tachycardia patient was brought in the hospital with complaints of tachycardia as he reached the emergency room that has been resolved.  He has not had any tachycardia overnight.  He has been on telemetry throughout the hospital stay with no arrhythmias.  He just needs to continue taking his Toprol-XL 25 mg daily.  #2 atypical pneumonia patient admitted with some cough.  Procalcitonin level was less than 0.10.  CT scan of the chest showed patchy areas of atelectasis or consolidation in both lung bases.  Fluid in the right major fissure.  Patient does have a 7 mm nodule in the right middle lung noncontrast chest CT is recommended 6 to 12 months today.  Nodule is stable at that time a repeat CT is recommended in 18 to 24 months.  Patient was started on Rocephin and azithromycin during the stay.WILL dc on augmentin for 7 days.  #3 hypertension continue Norvasc.  #4  question new onset hypothyroidism TSH 7.72 but within normal T4.  Follow-up TSH in 6 weeks. Discharge Instructions  Discharge Instructions    Call MD for:  persistant dizziness or light-headedness   Complete by:  As directed    Call MD for:  persistant nausea and vomiting   Complete by:  As directed    Call MD for:  severe uncontrolled pain   Complete by:  As directed    Call MD for:  temperature >100.4   Complete by:  As directed    Diet - low sodium heart healthy   Complete by:  As directed    Increase activity slowly   Complete by:  As directed      Allergies as of 10/26/2017   No Known Allergies     Medication List    TAKE these medications   ADULT ONE DAILY GUMMIES Chew Chew 2 each by mouth every evening.   amLODipine 2.5 MG tablet Commonly known as:  NORVASC Take 1 tablet (2.5 mg total) by mouth daily.   amoxicillin-clavulanate 875-125 MG tablet Commonly known as:  AUGMENTIN Take 1 tablet by mouth 2 (two) times daily for 7 days.   aspirin 325 MG EC tablet Take 1 tablet (325 mg total) by mouth daily.   atorvastatin 80 MG tablet Commonly known as:  LIPITOR Take 1 tablet (80 mg total) by mouth daily at 6 PM.   diphenhydrAMINE 25 mg capsule Commonly known as:  BENADRYL Take 25 mg by mouth every 12 (twelve) hours as needed for itching.   guaifenesin 100 MG/5ML syrup  Commonly known as:  ROBITUSSIN Take 300 mg by mouth every 8 (eight) hours as needed for cough or congestion.   loratadine 10 MG tablet Commonly known as:  CLARITIN Take 10 mg by mouth daily.   meloxicam 15 MG tablet Commonly known as:  MOBIC Take 15 mg by mouth daily as needed for pain.   metoprolol succinate 25 MG 24 hr tablet Commonly known as:  TOPROL-XL Take 25 mg by mouth daily.   omeprazole 20 MG capsule Commonly known as:  PRILOSEC Take 1 capsule (20 mg total) by mouth daily before breakfast.   pentosan polysulfate 100 MG capsule Commonly known as:  ELMIRON Take 2 capsules (200  mg total) by mouth daily.   tamsulosin 0.4 MG Caps capsule Commonly known as:  FLOMAX Take 1 capsule (0.4 mg total) by mouth daily.   zinc oxide 11.3 % Crea cream Commonly known as:  BALMEX Apply 1 application topically 2 (two) times daily.       No Known Allergies  Consultations: None Procedures/Studies: Ct Angio Chest Pe W And/or Wo Contrast  Result Date: 10/25/2017 CLINICAL DATA:  Cough for 1 month. Tachycardia. EXAM: CT ANGIOGRAPHY CHEST WITH CONTRAST TECHNIQUE: Multidetector CT imaging of the chest was performed using the standard protocol during bolus administration of intravenous contrast. Multiplanar CT image reconstructions and MIPs were obtained to evaluate the vascular anatomy. CONTRAST:  100 mL Isovue 370 COMPARISON:  None. FINDINGS: Cardiovascular: Good opacification of the central and segmental pulmonary arteries. No evidence of significant pulmonary embolus. Normal heart size. No pericardial effusion. Normal caliber thoracic aorta with scattered calcifications. Great vessel origins are patent. Mediastinum/Nodes: Esophagus is decompressed. No significant lymphadenopathy in the chest. Lungs/Pleura: Motion artifact limits examination. Shallow inspiration. Atelectasis with patchy areas of infiltration/consolidation in both lung bases. Fluid in the right major fissure. No pneumothorax. No significant effusions. Right middle lung nodule measuring 7 mm in diameter. Upper Abdomen: No acute process demonstrated in the upper abdomen. Colonic interposition under the right hemidiaphragm anterior to the liver. Musculoskeletal: Degenerative changes in the spine. No destructive bone lesions. Review of the MIP images confirms the above findings. IMPRESSION: 1. No evidence of significant pulmonary embolus. 2. Patchy areas of atelectasis or consolidation in both lung bases. Fluid in the right major fissure. 3. 7 mm nodule in the right middle lung. Non-contrast chest CT at 6-12 months is  recommended. If the nodule is stable at time of repeat CT, then future CT at 18-24 months (from today's scan) is considered optional for low-risk patients, but is recommended for high-risk patients. This recommendation follows the consensus statement: Guidelines for Management of Incidental Pulmonary Nodules Detected on CT Images: From the Fleischner Society 2017; Radiology 2017; 284:228-243. Aortic Atherosclerosis (ICD10-I70.0). Electronically Signed   By: Burman Nieves M.D.   On: 10/25/2017 05:47   Dg Chest Portable 1 View  Result Date: 10/25/2017 CLINICAL DATA:  Tachycardia EXAM: PORTABLE CHEST 1 VIEW COMPARISON:  None. FINDINGS: There is shallow lung inflation with bibasilar atelectasis. Cardiomediastinal contours are normal. There is no pleural effusion or pneumothorax. Right hemidiaphragm remains elevated. IMPRESSION: Chronic elevation of the right hemidiaphragm. Bibasilar atelectasis. Electronically Signed   By: Deatra Robinson M.D.   On: 10/25/2017 03:43    (Echo, Carotid, EGD, Colonoscopy, ERCP)    Subjective: Resting in bed in no acute distress denies any cough chest pain shortness of breath nausea vomiting or diarrhea complains of being hungry and wanting to order breakfast.  Discharge Exam: Vitals:   10/25/17 2307 10/26/17  0702  BP: 118/65 134/76  Pulse: 72 83  Resp: 12 20  Temp: 97.8 F (36.6 C) 99 F (37.2 C)  SpO2: 94% 93%   Vitals:   10/25/17 1339 10/25/17 2307 10/26/17 0416 10/26/17 0702  BP: 123/68 118/65  134/76  Pulse: 73 72  83  Resp: 20 12  20   Temp: 98.8 F (37.1 C) 97.8 F (36.6 C)  99 F (37.2 C)  TempSrc: Oral Oral  Oral  SpO2: 96% 94%  93%  Weight:   88.7 kg     General: Pt is alert, awake, not in acute distress Cardiovascular: RRR, S1/S2 +, no rubs, no gallops Respiratory: few rhonchi  bilaterally, no wheezing, no rhonchi Abdominal: Soft, NT, ND, bowel sounds + Extremities: no edema, no cyanosis    The results of significant diagnostics from  this hospitalization (including imaging, microbiology, ancillary and laboratory) are listed below for reference.     Microbiology: Recent Results (from the past 240 hour(s))  MRSA PCR Screening     Status: None   Collection Time: 10/25/17  9:20 AM  Result Value Ref Range Status   MRSA by PCR NEGATIVE NEGATIVE Final    Comment:        The GeneXpert MRSA Assay (FDA approved for NASAL specimens only), is one component of a comprehensive MRSA colonization surveillance program. It is not intended to diagnose MRSA infection nor to guide or monitor treatment for MRSA infections. Performed at [facility], [address] [place], [place] [address]      Labs: BNP (last 3 results) No results for input(s): BNP in the last 8760 hours. Basic Metabolic Panel: Recent Labs  Lab 10/25/17 0256 10/26/17 0743  NA 136 143  K 3.8 3.9  CL 96* 107  CO2  --  27  GLUCOSE 101* 93  BUN 8 8  CREATININE 0.80 0.71  CALCIUM  --  8.1*  MG  --  2.0   Liver Function Tests: Recent Labs  Lab 10/26/17 0743  AST 20  ALT 17  ALKPHOS 81  BILITOT 0.4  PROT 5.8*  ALBUMIN 2.8*   No results for input(s): LIPASE, AMYLASE in the last 168 hours. No results for input(s): AMMONIA in the last 168 hours. CBC: Recent Labs  Lab 10/25/17 0247 10/25/17 0256 10/26/17 0743  WBC 14.3*  --  10.5  NEUTROABS 9.8*  --   --   HGB 15.5 16.0 13.5  HCT 48.3 47.0 42.7  MCV 89.3  --  91.0  PLT 359  --  278   Cardiac Enzymes: No results for input(s): CKTOTAL, CKMB, CKMBINDEX, TROPONINI in the last 168 hours. BNP: Invalid input(s): POCBNP CBG: No results for input(s): GLUCAP in the last 168 hours. D-Dimer Recent Labs    10/25/17 0247  DDIMER 1.11*   Hgb A1c No results for input(s): HGBA1C in the last 72 hours. Lipid Profile No results for input(s): CHOL, HDL, LDLCALC, TRIG, CHOLHDL, LDLDIRECT in the last 72 hours. Thyroid function studies Recent Labs    10/25/17 1015  TSH 7.721*    Anemia work up No results for input(s): VITAMINB12, FOLATE, FERRITIN, TIBC, IRON, RETICCTPCT in the last 72 hours. Urinalysis    Component Value Date/Time   COLORURINE YELLOW 06/08/2017 1309   APPEARANCEUR CLEAR 06/08/2017 1309   LABSPEC 1.014 06/08/2017 1309   PHURINE 6.0 06/08/2017 1309   GLUCOSEU NEGATIVE 06/08/2017 1309   GLUCOSEU NEGATIVE 10/06/2006 1617   HGBUR NEGATIVE 06/08/2017 1309   BILIRUBINUR NEGATIVE 06/08/2017 1309  KETONESUR 5 (A) 06/08/2017 1309   PROTEINUR NEGATIVE 06/08/2017 1309   UROBILINOGEN 0.2 mg/dL 16/10/9602 5409   NITRITE NEGATIVE 06/08/2017 1309   LEUKOCYTESUR TRACE (A) 06/08/2017 1309   Sepsis Labs Invalid input(s): PROCALCITONIN,  WBC,  LACTICIDVEN Microbiology Recent Results (from the past 240 hour(s))  MRSA PCR Screening     Status: None   Collection Time: 10/25/17  9:20 AM  Result Value Ref Range Status   MRSA by PCR NEGATIVE NEGATIVE Final    Comment:        The GeneXpert MRSA Assay (FDA approved for NASAL specimens only), is one component of a comprehensive MRSA colonization surveillance program. It is not intended to diagnose MRSA infection nor to guide or monitor treatment for MRSA infections. Performed at [facility], [address], [place], [place] [address]      Time coordinating discharge:  35 minutes  SIGNED:   Alwyn Ren, MD  [facility] 10/26/2017, 10:11 AM Pager   If 7PM-7AM, please contact night-coverage [email or web address] Password [email or web address]

## 2017-10-26 NOTE — Evaluation (Signed)
Physical Therapy Evaluation Patient Details Name: Cody [name] MRN: [id number] DOB: 02/28/1940 Today's Date: 10/26/2017   History of Present Illness  77 YO male admitted to ED 10/25/17 from [facility] for tachycardia and possible CAP. Pt with persistent cough over the past few weeks. PMH includes HLD, HTN, interstitial cystitis, stroke, L knee scope, bilateral inguinal hernia repair.  Clinical Impression   Pt presents with weakness especially of bilateral LEs demonstrated in MMT and standing activity, decreased activity tolerance, increased time to perform all mobility tasks, fecal and urinary incontinence with mobility, and impaired sitting and standing balance. Pt appeared to demonstrate memory deficits as well. Pt to benefit from acute PT while in hospital. Pt able to stand with RW with mod-max assist, but bilateral LEs presented with buckling after standing for approximately 1.5 minutes. PT recommending SNF placement. Pt stated he wants to go home and does not want to go back to [facility], but per wife [facility] has been a good facility for him and wife is unable to provide the level of care pt needs. Will continue to follow while acute.     Follow Up Recommendations SNF    Equipment Recommendations  None recommended by PT    Recommendations for Other Services       Precautions / Restrictions Precautions Precautions: Fall Restrictions Weight Bearing Restrictions: No      Mobility  Bed Mobility Overal bed mobility: Needs Assistance Bed Mobility: Supine to Sit;Sit to Supine;Rolling Rolling: Min assist;+2 for physical assistance   Supine to sit: Mod assist;HOB elevated Sit to supine: Mod assist;HOB elevated   General bed mobility comments: Mod assist for supine<>sit sequencing, LE management, trunk management, scooting to EOB with bed pad, and steadying at EOB. Pt with grunting throughout, but stated he was not having pain when asked. PT cued pt to place feet on floor due to  impaired sitting balance. Pt sat EOB for 3 minutes without PT support. Pt found to be soiled upon sitting. Pt soiled second time once returned to bed, NT called in to assist with pericare and rolling pt. Pt with use of bedrails for rolling. Pt's condom cath came loose during mobility, NT applied new condom cath after session.   Transfers Overall transfer level: Needs assistance Equipment used: Rolling walker (2 wheeled) Transfers: Sit to/from Stand Sit to Stand: Max assist;Mod assist;From elevated surface         General transfer comment: Mod-max assist for power up, hip extension, and steadying upon standing. Pt with grunting throughout standing and presented with difficulty in LE extension given weakness. PT performed pericare in standing, pt fatiguing and needing to sit after approximately 1.5 minutes.   Ambulation/Gait Ambulation/Gait assistance: (not attempted given pt's difficulty with standing)              Stairs            Wheelchair Mobility    Modified Rankin (Stroke Patients Only)       Balance Overall balance assessment: Needs assistance Sitting-balance support: Feet supported Sitting balance-Leahy Scale: Fair Sitting balance - Comments: will not accept perturbations at EOB   Standing balance support: Bilateral upper extremity supported Standing balance-Leahy Scale: Poor Standing balance comment: relies on PT and RW for support in standing                              Pertinent Vitals/Pain Pain Assessment: No/denies pain    Home Living Family/patient expects  to be discharged to:: Skilled nursing facility                      Prior Function Level of Independence: Needs assistance   Gait / Transfers Assistance Needed: Pt states that he uses a wheelchair at [facility], stating "they didn't require me to walk with a walker". Pt did state the walks "as far as I need to, but not too far". Wife arrived at end of session and stated pt does  not walk at all given LE weakness.  ADL's / Homemaking Assistance Needed: Assist with bathing and dressing, dining hall for food        Hand Dominance   Dominant Hand: Right    Extremity/Trunk Assessment   Upper Extremity Assessment Upper Extremity Assessment: Generalized weakness    Lower Extremity Assessment Lower Extremity Assessment: LLE deficits/detail;RLE deficits/detail RLE Deficits / Details: 3-/5 hip flexion, knee extension. 3/5 ankle DF/PF. LLE Deficits / Details: 3-/5 hip flexion, knee extension. 3/5 ankle DF/PF. LLE Sensation: WNL    Cervical / Trunk Assessment Cervical / Trunk Assessment: Normal  Communication   Communication: No difficulties  Cognition Arousal/Alertness: Awake/alert Behavior During Therapy: WFL for tasks assessed/performed Overall Cognitive Status: Impaired/Different from baseline Area of Impairment: Memory;Problem solving                     Memory: Decreased short-term memory       Problem Solving: Difficulty sequencing;Requires verbal cues;Requires tactile cues;Slow processing;Decreased initiation General Comments: Pt reports living at home 4 months ago, then going to SNF. Pt did not remember whether he had a house, apartment, or condo and states "My wife keeps the deed" (Wife states they live in [place]). Pt stated he lived with children and wife at one time, but then stated his kids do not live with him. Also, pt could not remember if his home had stairs or not. Pt with contradictory statements throughout session regarding PLOF.      General Comments      Exercises     Assessment/Plan    PT Assessment Patient needs continued PT services  PT Problem List Decreased strength;Decreased activity tolerance;Decreased knowledge of use of DME;Decreased balance;Decreased safety awareness;Decreased mobility       PT Treatment Interventions DME instruction;Therapeutic activities;Gait training;Therapeutic exercise;Patient/family  education;Balance training;Functional mobility training    PT Goals (Current goals can be found in the Care Plan section)  Acute Rehab PT Goals Patient Stated Goal: none stated  PT Goal Formulation: With patient Time For Goal Achievement: 11/09/17 Potential to Achieve Goals: Good    Frequency Min 2X/week   Barriers to discharge        Co-evaluation               AM-PAC PT "6 Clicks" Daily Activity  Outcome Measure Difficulty turning over in bed (including adjusting bedclothes, sheets and blankets)?: Unable Difficulty moving from lying on back to sitting on the side of the bed? : Unable Difficulty sitting down on and standing up from a chair with arms (e.g., wheelchair, bedside commode, etc,.)?: Unable Help needed moving to and from a bed to chair (including a wheelchair)?: A Lot Help needed walking in hospital room?: A Lot Help needed climbing 3-5 steps with a railing? : Total 6 Click Score: 8    End of Session Equipment Utilized During Treatment: Gait belt Activity Tolerance: Patient limited by fatigue;Other (comment)(LE weakness) Patient left: in bed;with bed alarm set;with call bell/phone within reach;with family/visitor  present Nurse Communication: Mobility status PT Visit Diagnosis: Unsteadiness on feet (R26.81);Muscle weakness (generalized) (M62.81)    Time: 5621-3086 PT Time Calculation (min) (ACUTE ONLY): 44 min   Charges:   PT Evaluation $PT Eval Low Complexity: 1 Low PT Treatments $Therapeutic Activity: 23-37 mins        Nicola Police, PT Acute Rehabilitation Services Pager [phone]  Office [phone]  Teofil Maniaci D Despina Hidden 10/26/2017, 12:57 PM

## 2017-10-26 NOTE — Progress Notes (Signed)
Pt discharged to [facility], report called. Pt stable condition. EMS here to pick up pt, wife and daughter n law at bedside with patient. SRP, RN

## 2017-10-28 ENCOUNTER — Telehealth: Payer: Self-pay | Admitting: *Deleted

## 2017-10-28 DIAGNOSIS — F411 Generalized anxiety disorder: Secondary | ICD-10-CM | POA: Diagnosis not present

## 2017-10-28 DIAGNOSIS — F4322 Adjustment disorder with anxiety: Secondary | ICD-10-CM | POA: Diagnosis not present

## 2017-10-28 NOTE — Telephone Encounter (Signed)
Called pt to make hosp f/u appt spoke w/wife she states husband is staying at [facility] now, and he see the provider there weekly. She states if he ever come back home and need to see Dr. Lawerance Bach will call to make an appt.Marland KitchenRaechel Chute

## 2017-10-29 DIAGNOSIS — R1310 Dysphagia, unspecified: Secondary | ICD-10-CM | POA: Diagnosis not present

## 2017-10-29 DIAGNOSIS — R293 Abnormal posture: Secondary | ICD-10-CM | POA: Diagnosis not present

## 2017-10-29 DIAGNOSIS — K219 Gastro-esophageal reflux disease without esophagitis: Secondary | ICD-10-CM | POA: Diagnosis not present

## 2017-10-29 DIAGNOSIS — M6389 Disorders of muscle in diseases classified elsewhere, multiple sites: Secondary | ICD-10-CM | POA: Diagnosis not present

## 2017-10-29 DIAGNOSIS — R41841 Cognitive communication deficit: Secondary | ICD-10-CM | POA: Diagnosis not present

## 2017-10-29 DIAGNOSIS — F0390 Unspecified dementia without behavioral disturbance: Secondary | ICD-10-CM | POA: Diagnosis not present

## 2017-10-30 DIAGNOSIS — F0390 Unspecified dementia without behavioral disturbance: Secondary | ICD-10-CM | POA: Diagnosis not present

## 2017-10-30 DIAGNOSIS — R293 Abnormal posture: Secondary | ICD-10-CM | POA: Diagnosis not present

## 2017-10-30 DIAGNOSIS — R41841 Cognitive communication deficit: Secondary | ICD-10-CM | POA: Diagnosis not present

## 2017-10-30 DIAGNOSIS — R1310 Dysphagia, unspecified: Secondary | ICD-10-CM | POA: Diagnosis not present

## 2017-10-30 DIAGNOSIS — M6389 Disorders of muscle in diseases classified elsewhere, multiple sites: Secondary | ICD-10-CM | POA: Diagnosis not present

## 2017-10-30 DIAGNOSIS — K219 Gastro-esophageal reflux disease without esophagitis: Secondary | ICD-10-CM | POA: Diagnosis not present

## 2017-10-30 LAB — CULTURE, BLOOD (ROUTINE X 2)
CULTURE: NO GROWTH
Culture: NO GROWTH
SPECIAL REQUESTS: ADEQUATE

## 2017-11-02 DIAGNOSIS — Z79899 Other long term (current) drug therapy: Secondary | ICD-10-CM | POA: Diagnosis not present

## 2017-11-02 DIAGNOSIS — K219 Gastro-esophageal reflux disease without esophagitis: Secondary | ICD-10-CM | POA: Diagnosis not present

## 2017-11-02 DIAGNOSIS — D649 Anemia, unspecified: Secondary | ICD-10-CM | POA: Diagnosis not present

## 2017-11-02 DIAGNOSIS — M6389 Disorders of muscle in diseases classified elsewhere, multiple sites: Secondary | ICD-10-CM | POA: Diagnosis not present

## 2017-11-02 DIAGNOSIS — F0390 Unspecified dementia without behavioral disturbance: Secondary | ICD-10-CM | POA: Diagnosis not present

## 2017-11-02 DIAGNOSIS — R293 Abnormal posture: Secondary | ICD-10-CM | POA: Diagnosis not present

## 2017-11-02 DIAGNOSIS — R1310 Dysphagia, unspecified: Secondary | ICD-10-CM | POA: Diagnosis not present

## 2017-11-02 DIAGNOSIS — R41841 Cognitive communication deficit: Secondary | ICD-10-CM | POA: Diagnosis not present

## 2017-11-03 DIAGNOSIS — F0390 Unspecified dementia without behavioral disturbance: Secondary | ICD-10-CM | POA: Diagnosis not present

## 2017-11-03 DIAGNOSIS — M6389 Disorders of muscle in diseases classified elsewhere, multiple sites: Secondary | ICD-10-CM | POA: Diagnosis not present

## 2017-11-03 DIAGNOSIS — R41841 Cognitive communication deficit: Secondary | ICD-10-CM | POA: Diagnosis not present

## 2017-11-03 DIAGNOSIS — R1310 Dysphagia, unspecified: Secondary | ICD-10-CM | POA: Diagnosis not present

## 2017-11-03 DIAGNOSIS — K219 Gastro-esophageal reflux disease without esophagitis: Secondary | ICD-10-CM | POA: Diagnosis not present

## 2017-11-03 DIAGNOSIS — R293 Abnormal posture: Secondary | ICD-10-CM | POA: Diagnosis not present

## 2017-11-04 DIAGNOSIS — R41841 Cognitive communication deficit: Secondary | ICD-10-CM | POA: Diagnosis not present

## 2017-11-04 DIAGNOSIS — K219 Gastro-esophageal reflux disease without esophagitis: Secondary | ICD-10-CM | POA: Diagnosis not present

## 2017-11-04 DIAGNOSIS — M6389 Disorders of muscle in diseases classified elsewhere, multiple sites: Secondary | ICD-10-CM | POA: Diagnosis not present

## 2017-11-04 DIAGNOSIS — F0390 Unspecified dementia without behavioral disturbance: Secondary | ICD-10-CM | POA: Diagnosis not present

## 2017-11-04 DIAGNOSIS — R293 Abnormal posture: Secondary | ICD-10-CM | POA: Diagnosis not present

## 2017-11-04 DIAGNOSIS — R1310 Dysphagia, unspecified: Secondary | ICD-10-CM | POA: Diagnosis not present

## 2017-11-11 DIAGNOSIS — F4322 Adjustment disorder with anxiety: Secondary | ICD-10-CM | POA: Diagnosis not present

## 2017-11-11 DIAGNOSIS — F411 Generalized anxiety disorder: Secondary | ICD-10-CM | POA: Diagnosis not present

## 2017-11-17 DIAGNOSIS — L988 Other specified disorders of the skin and subcutaneous tissue: Secondary | ICD-10-CM | POA: Diagnosis not present

## 2017-11-24 DIAGNOSIS — L988 Other specified disorders of the skin and subcutaneous tissue: Secondary | ICD-10-CM | POA: Diagnosis not present

## 2017-11-27 DIAGNOSIS — M6389 Disorders of muscle in diseases classified elsewhere, multiple sites: Secondary | ICD-10-CM | POA: Diagnosis not present

## 2017-11-27 DIAGNOSIS — G309 Alzheimer's disease, unspecified: Secondary | ICD-10-CM | POA: Diagnosis not present

## 2017-11-27 DIAGNOSIS — R293 Abnormal posture: Secondary | ICD-10-CM | POA: Diagnosis not present

## 2017-11-27 DIAGNOSIS — F0390 Unspecified dementia without behavioral disturbance: Secondary | ICD-10-CM | POA: Diagnosis not present

## 2017-11-30 DIAGNOSIS — Z79899 Other long term (current) drug therapy: Secondary | ICD-10-CM | POA: Diagnosis not present

## 2017-11-30 DIAGNOSIS — E039 Hypothyroidism, unspecified: Secondary | ICD-10-CM | POA: Diagnosis not present

## 2017-12-01 DIAGNOSIS — F0390 Unspecified dementia without behavioral disturbance: Secondary | ICD-10-CM | POA: Diagnosis not present

## 2017-12-01 DIAGNOSIS — R293 Abnormal posture: Secondary | ICD-10-CM | POA: Diagnosis not present

## 2017-12-01 DIAGNOSIS — G309 Alzheimer's disease, unspecified: Secondary | ICD-10-CM | POA: Diagnosis not present

## 2017-12-01 DIAGNOSIS — M6389 Disorders of muscle in diseases classified elsewhere, multiple sites: Secondary | ICD-10-CM | POA: Diagnosis not present

## 2017-12-17 ENCOUNTER — Ambulatory Visit: Payer: BLUE CROSS/BLUE SHIELD | Admitting: Neurology

## 2017-12-28 DIAGNOSIS — I739 Peripheral vascular disease, unspecified: Secondary | ICD-10-CM | POA: Diagnosis not present

## 2017-12-28 DIAGNOSIS — B351 Tinea unguium: Secondary | ICD-10-CM | POA: Diagnosis not present

## 2018-01-11 DIAGNOSIS — E039 Hypothyroidism, unspecified: Secondary | ICD-10-CM | POA: Diagnosis not present

## 2018-01-11 DIAGNOSIS — Z79899 Other long term (current) drug therapy: Secondary | ICD-10-CM | POA: Diagnosis not present

## 2018-02-19 DIAGNOSIS — F411 Generalized anxiety disorder: Secondary | ICD-10-CM | POA: Diagnosis not present

## 2018-02-20 DIAGNOSIS — D649 Anemia, unspecified: Secondary | ICD-10-CM | POA: Diagnosis not present

## 2018-02-20 DIAGNOSIS — Z79899 Other long term (current) drug therapy: Secondary | ICD-10-CM | POA: Diagnosis not present

## 2018-02-22 DIAGNOSIS — E039 Hypothyroidism, unspecified: Secondary | ICD-10-CM | POA: Diagnosis not present

## 2018-02-22 DIAGNOSIS — Z79899 Other long term (current) drug therapy: Secondary | ICD-10-CM | POA: Diagnosis not present

## 2018-03-03 DIAGNOSIS — F411 Generalized anxiety disorder: Secondary | ICD-10-CM | POA: Diagnosis not present

## 2018-03-17 DIAGNOSIS — F4322 Adjustment disorder with anxiety: Secondary | ICD-10-CM | POA: Diagnosis not present

## 2018-03-17 DIAGNOSIS — I639 Cerebral infarction, unspecified: Secondary | ICD-10-CM | POA: Diagnosis not present

## 2018-03-17 DIAGNOSIS — R4181 Age-related cognitive decline: Secondary | ICD-10-CM | POA: Diagnosis not present

## 2018-03-17 DIAGNOSIS — R41841 Cognitive communication deficit: Secondary | ICD-10-CM | POA: Diagnosis not present

## 2018-03-17 DIAGNOSIS — F411 Generalized anxiety disorder: Secondary | ICD-10-CM | POA: Diagnosis not present

## 2018-03-19 DIAGNOSIS — I639 Cerebral infarction, unspecified: Secondary | ICD-10-CM | POA: Diagnosis not present

## 2018-03-19 DIAGNOSIS — R41841 Cognitive communication deficit: Secondary | ICD-10-CM | POA: Diagnosis not present

## 2018-03-19 DIAGNOSIS — R4181 Age-related cognitive decline: Secondary | ICD-10-CM | POA: Diagnosis not present

## 2018-03-23 DIAGNOSIS — R41841 Cognitive communication deficit: Secondary | ICD-10-CM | POA: Diagnosis not present

## 2018-03-23 DIAGNOSIS — R4181 Age-related cognitive decline: Secondary | ICD-10-CM | POA: Diagnosis not present

## 2018-03-23 DIAGNOSIS — I639 Cerebral infarction, unspecified: Secondary | ICD-10-CM | POA: Diagnosis not present

## 2018-03-26 DIAGNOSIS — R4181 Age-related cognitive decline: Secondary | ICD-10-CM | POA: Diagnosis not present

## 2018-03-26 DIAGNOSIS — I639 Cerebral infarction, unspecified: Secondary | ICD-10-CM | POA: Diagnosis not present

## 2018-03-26 DIAGNOSIS — R41841 Cognitive communication deficit: Secondary | ICD-10-CM | POA: Diagnosis not present

## 2018-03-29 DIAGNOSIS — R41841 Cognitive communication deficit: Secondary | ICD-10-CM | POA: Diagnosis not present

## 2018-03-29 DIAGNOSIS — I639 Cerebral infarction, unspecified: Secondary | ICD-10-CM | POA: Diagnosis not present

## 2018-03-29 DIAGNOSIS — R4181 Age-related cognitive decline: Secondary | ICD-10-CM | POA: Diagnosis not present

## 2018-03-30 DIAGNOSIS — R41841 Cognitive communication deficit: Secondary | ICD-10-CM | POA: Diagnosis not present

## 2018-03-30 DIAGNOSIS — R4181 Age-related cognitive decline: Secondary | ICD-10-CM | POA: Diagnosis not present

## 2018-03-30 DIAGNOSIS — I639 Cerebral infarction, unspecified: Secondary | ICD-10-CM | POA: Diagnosis not present

## 2018-03-31 DIAGNOSIS — I639 Cerebral infarction, unspecified: Secondary | ICD-10-CM | POA: Diagnosis not present

## 2018-03-31 DIAGNOSIS — R4181 Age-related cognitive decline: Secondary | ICD-10-CM | POA: Diagnosis not present

## 2018-03-31 DIAGNOSIS — R41841 Cognitive communication deficit: Secondary | ICD-10-CM | POA: Diagnosis not present

## 2018-04-01 DIAGNOSIS — I639 Cerebral infarction, unspecified: Secondary | ICD-10-CM | POA: Diagnosis not present

## 2018-04-01 DIAGNOSIS — R4181 Age-related cognitive decline: Secondary | ICD-10-CM | POA: Diagnosis not present

## 2018-04-01 DIAGNOSIS — R41841 Cognitive communication deficit: Secondary | ICD-10-CM | POA: Diagnosis not present

## 2018-04-05 DIAGNOSIS — Z79899 Other long term (current) drug therapy: Secondary | ICD-10-CM | POA: Diagnosis not present

## 2018-04-05 DIAGNOSIS — E039 Hypothyroidism, unspecified: Secondary | ICD-10-CM | POA: Diagnosis not present

## 2018-04-06 DIAGNOSIS — R41841 Cognitive communication deficit: Secondary | ICD-10-CM | POA: Diagnosis not present

## 2018-04-06 DIAGNOSIS — R4181 Age-related cognitive decline: Secondary | ICD-10-CM | POA: Diagnosis not present

## 2018-04-06 DIAGNOSIS — I639 Cerebral infarction, unspecified: Secondary | ICD-10-CM | POA: Diagnosis not present

## 2018-04-08 DIAGNOSIS — I639 Cerebral infarction, unspecified: Secondary | ICD-10-CM | POA: Diagnosis not present

## 2018-04-08 DIAGNOSIS — R41841 Cognitive communication deficit: Secondary | ICD-10-CM | POA: Diagnosis not present

## 2018-04-08 DIAGNOSIS — R4181 Age-related cognitive decline: Secondary | ICD-10-CM | POA: Diagnosis not present

## 2018-04-09 DIAGNOSIS — I639 Cerebral infarction, unspecified: Secondary | ICD-10-CM | POA: Diagnosis not present

## 2018-04-09 DIAGNOSIS — R4181 Age-related cognitive decline: Secondary | ICD-10-CM | POA: Diagnosis not present

## 2018-04-09 DIAGNOSIS — R41841 Cognitive communication deficit: Secondary | ICD-10-CM | POA: Diagnosis not present

## 2018-04-11 DIAGNOSIS — R7301 Impaired fasting glucose: Secondary | ICD-10-CM | POA: Diagnosis not present

## 2018-04-11 DIAGNOSIS — I10 Essential (primary) hypertension: Secondary | ICD-10-CM | POA: Diagnosis not present

## 2018-04-11 DIAGNOSIS — E039 Hypothyroidism, unspecified: Secondary | ICD-10-CM | POA: Diagnosis not present

## 2018-04-11 DIAGNOSIS — R1310 Dysphagia, unspecified: Secondary | ICD-10-CM | POA: Diagnosis not present

## 2018-04-13 DIAGNOSIS — I639 Cerebral infarction, unspecified: Secondary | ICD-10-CM | POA: Diagnosis not present

## 2018-04-13 DIAGNOSIS — R41841 Cognitive communication deficit: Secondary | ICD-10-CM | POA: Diagnosis not present

## 2018-04-13 DIAGNOSIS — R4181 Age-related cognitive decline: Secondary | ICD-10-CM | POA: Diagnosis not present

## 2018-04-14 DIAGNOSIS — R4181 Age-related cognitive decline: Secondary | ICD-10-CM | POA: Diagnosis not present

## 2018-04-14 DIAGNOSIS — R41841 Cognitive communication deficit: Secondary | ICD-10-CM | POA: Diagnosis not present

## 2018-04-14 DIAGNOSIS — I639 Cerebral infarction, unspecified: Secondary | ICD-10-CM | POA: Diagnosis not present

## 2018-04-15 DIAGNOSIS — R41841 Cognitive communication deficit: Secondary | ICD-10-CM | POA: Diagnosis not present

## 2018-04-15 DIAGNOSIS — I639 Cerebral infarction, unspecified: Secondary | ICD-10-CM | POA: Diagnosis not present

## 2018-04-15 DIAGNOSIS — R4181 Age-related cognitive decline: Secondary | ICD-10-CM | POA: Diagnosis not present

## 2018-04-20 DIAGNOSIS — I639 Cerebral infarction, unspecified: Secondary | ICD-10-CM | POA: Diagnosis not present

## 2018-04-20 DIAGNOSIS — R41841 Cognitive communication deficit: Secondary | ICD-10-CM | POA: Diagnosis not present

## 2018-04-20 DIAGNOSIS — R4181 Age-related cognitive decline: Secondary | ICD-10-CM | POA: Diagnosis not present

## 2018-04-21 DIAGNOSIS — I639 Cerebral infarction, unspecified: Secondary | ICD-10-CM | POA: Diagnosis not present

## 2018-04-21 DIAGNOSIS — R41841 Cognitive communication deficit: Secondary | ICD-10-CM | POA: Diagnosis not present

## 2018-04-21 DIAGNOSIS — R4181 Age-related cognitive decline: Secondary | ICD-10-CM | POA: Diagnosis not present

## 2018-04-22 DIAGNOSIS — R41841 Cognitive communication deficit: Secondary | ICD-10-CM | POA: Diagnosis not present

## 2018-04-22 DIAGNOSIS — R4181 Age-related cognitive decline: Secondary | ICD-10-CM | POA: Diagnosis not present

## 2018-04-22 DIAGNOSIS — I639 Cerebral infarction, unspecified: Secondary | ICD-10-CM | POA: Diagnosis not present

## 2018-05-03 DIAGNOSIS — E119 Type 2 diabetes mellitus without complications: Secondary | ICD-10-CM | POA: Diagnosis not present

## 2018-05-03 DIAGNOSIS — E039 Hypothyroidism, unspecified: Secondary | ICD-10-CM | POA: Diagnosis not present

## 2018-05-03 DIAGNOSIS — I10 Essential (primary) hypertension: Secondary | ICD-10-CM | POA: Diagnosis not present

## 2018-05-05 DIAGNOSIS — R05 Cough: Secondary | ICD-10-CM | POA: Diagnosis not present

## 2018-05-07 DIAGNOSIS — Z20828 Contact with and (suspected) exposure to other viral communicable diseases: Secondary | ICD-10-CM | POA: Diagnosis not present

## 2018-05-10 DIAGNOSIS — E119 Type 2 diabetes mellitus without complications: Secondary | ICD-10-CM | POA: Diagnosis not present

## 2018-05-10 DIAGNOSIS — D649 Anemia, unspecified: Secondary | ICD-10-CM | POA: Diagnosis not present

## 2018-05-10 DIAGNOSIS — E039 Hypothyroidism, unspecified: Secondary | ICD-10-CM | POA: Diagnosis not present

## 2018-05-10 DIAGNOSIS — I10 Essential (primary) hypertension: Secondary | ICD-10-CM | POA: Diagnosis not present

## 2018-05-10 DIAGNOSIS — N390 Urinary tract infection, site not specified: Secondary | ICD-10-CM | POA: Diagnosis not present

## 2018-05-15 DIAGNOSIS — N390 Urinary tract infection, site not specified: Secondary | ICD-10-CM | POA: Diagnosis not present

## 2018-05-15 DIAGNOSIS — I10 Essential (primary) hypertension: Secondary | ICD-10-CM | POA: Diagnosis not present

## 2018-05-15 DIAGNOSIS — E119 Type 2 diabetes mellitus without complications: Secondary | ICD-10-CM | POA: Diagnosis not present

## 2018-05-15 DIAGNOSIS — E039 Hypothyroidism, unspecified: Secondary | ICD-10-CM | POA: Diagnosis not present

## 2018-05-15 DIAGNOSIS — D649 Anemia, unspecified: Secondary | ICD-10-CM | POA: Diagnosis not present

## 2018-06-06 DIAGNOSIS — U071 COVID-19: Secondary | ICD-10-CM | POA: Diagnosis not present

## 2018-06-06 DIAGNOSIS — G309 Alzheimer's disease, unspecified: Secondary | ICD-10-CM | POA: Diagnosis not present

## 2018-06-06 DIAGNOSIS — I10 Essential (primary) hypertension: Secondary | ICD-10-CM | POA: Diagnosis not present

## 2018-06-07 DIAGNOSIS — Z20828 Contact with and (suspected) exposure to other viral communicable diseases: Secondary | ICD-10-CM | POA: Diagnosis not present

## 2018-06-08 DIAGNOSIS — R1311 Dysphagia, oral phase: Secondary | ICD-10-CM | POA: Diagnosis not present

## 2018-06-08 DIAGNOSIS — R1314 Dysphagia, pharyngoesophageal phase: Secondary | ICD-10-CM | POA: Diagnosis not present

## 2018-06-08 DIAGNOSIS — U071 COVID-19: Secondary | ICD-10-CM | POA: Diagnosis not present

## 2018-06-10 DIAGNOSIS — R1314 Dysphagia, pharyngoesophageal phase: Secondary | ICD-10-CM | POA: Diagnosis not present

## 2018-06-10 DIAGNOSIS — U071 COVID-19: Secondary | ICD-10-CM | POA: Diagnosis not present

## 2018-06-10 DIAGNOSIS — R1311 Dysphagia, oral phase: Secondary | ICD-10-CM | POA: Diagnosis not present

## 2018-06-11 DIAGNOSIS — U071 COVID-19: Secondary | ICD-10-CM | POA: Diagnosis not present

## 2018-06-11 DIAGNOSIS — R1311 Dysphagia, oral phase: Secondary | ICD-10-CM | POA: Diagnosis not present

## 2018-06-11 DIAGNOSIS — R1314 Dysphagia, pharyngoesophageal phase: Secondary | ICD-10-CM | POA: Diagnosis not present

## 2018-06-14 DIAGNOSIS — G309 Alzheimer's disease, unspecified: Secondary | ICD-10-CM | POA: Diagnosis not present

## 2018-06-14 DIAGNOSIS — R293 Abnormal posture: Secondary | ICD-10-CM | POA: Diagnosis not present

## 2018-06-14 DIAGNOSIS — U071 COVID-19: Secondary | ICD-10-CM | POA: Diagnosis not present

## 2018-06-14 DIAGNOSIS — R1311 Dysphagia, oral phase: Secondary | ICD-10-CM | POA: Diagnosis not present

## 2018-06-14 DIAGNOSIS — R1314 Dysphagia, pharyngoesophageal phase: Secondary | ICD-10-CM | POA: Diagnosis not present

## 2018-06-15 DIAGNOSIS — U071 COVID-19: Secondary | ICD-10-CM | POA: Diagnosis not present

## 2018-06-15 DIAGNOSIS — R293 Abnormal posture: Secondary | ICD-10-CM | POA: Diagnosis not present

## 2018-06-15 DIAGNOSIS — G309 Alzheimer's disease, unspecified: Secondary | ICD-10-CM | POA: Diagnosis not present

## 2018-06-15 DIAGNOSIS — R1314 Dysphagia, pharyngoesophageal phase: Secondary | ICD-10-CM | POA: Diagnosis not present

## 2018-06-15 DIAGNOSIS — R1311 Dysphagia, oral phase: Secondary | ICD-10-CM | POA: Diagnosis not present

## 2018-06-16 DIAGNOSIS — G309 Alzheimer's disease, unspecified: Secondary | ICD-10-CM | POA: Diagnosis not present

## 2018-06-16 DIAGNOSIS — R1311 Dysphagia, oral phase: Secondary | ICD-10-CM | POA: Diagnosis not present

## 2018-06-16 DIAGNOSIS — R293 Abnormal posture: Secondary | ICD-10-CM | POA: Diagnosis not present

## 2018-06-16 DIAGNOSIS — R1314 Dysphagia, pharyngoesophageal phase: Secondary | ICD-10-CM | POA: Diagnosis not present

## 2018-06-16 DIAGNOSIS — U071 COVID-19: Secondary | ICD-10-CM | POA: Diagnosis not present

## 2018-06-21 DIAGNOSIS — R1311 Dysphagia, oral phase: Secondary | ICD-10-CM | POA: Diagnosis not present

## 2018-06-21 DIAGNOSIS — G309 Alzheimer's disease, unspecified: Secondary | ICD-10-CM | POA: Diagnosis not present

## 2018-06-21 DIAGNOSIS — R1314 Dysphagia, pharyngoesophageal phase: Secondary | ICD-10-CM | POA: Diagnosis not present

## 2018-06-21 DIAGNOSIS — U071 COVID-19: Secondary | ICD-10-CM | POA: Diagnosis not present

## 2018-06-21 DIAGNOSIS — R293 Abnormal posture: Secondary | ICD-10-CM | POA: Diagnosis not present

## 2018-06-22 DIAGNOSIS — R1314 Dysphagia, pharyngoesophageal phase: Secondary | ICD-10-CM | POA: Diagnosis not present

## 2018-06-22 DIAGNOSIS — R293 Abnormal posture: Secondary | ICD-10-CM | POA: Diagnosis not present

## 2018-06-22 DIAGNOSIS — U071 COVID-19: Secondary | ICD-10-CM | POA: Diagnosis not present

## 2018-06-22 DIAGNOSIS — G309 Alzheimer's disease, unspecified: Secondary | ICD-10-CM | POA: Diagnosis not present

## 2018-06-22 DIAGNOSIS — R1311 Dysphagia, oral phase: Secondary | ICD-10-CM | POA: Diagnosis not present

## 2018-06-23 DIAGNOSIS — G309 Alzheimer's disease, unspecified: Secondary | ICD-10-CM | POA: Diagnosis not present

## 2018-06-23 DIAGNOSIS — U071 COVID-19: Secondary | ICD-10-CM | POA: Diagnosis not present

## 2018-06-23 DIAGNOSIS — R1311 Dysphagia, oral phase: Secondary | ICD-10-CM | POA: Diagnosis not present

## 2018-06-23 DIAGNOSIS — R293 Abnormal posture: Secondary | ICD-10-CM | POA: Diagnosis not present

## 2018-06-23 DIAGNOSIS — R1314 Dysphagia, pharyngoesophageal phase: Secondary | ICD-10-CM | POA: Diagnosis not present

## 2018-06-24 DIAGNOSIS — R1314 Dysphagia, pharyngoesophageal phase: Secondary | ICD-10-CM | POA: Diagnosis not present

## 2018-06-24 DIAGNOSIS — R293 Abnormal posture: Secondary | ICD-10-CM | POA: Diagnosis not present

## 2018-06-24 DIAGNOSIS — G309 Alzheimer's disease, unspecified: Secondary | ICD-10-CM | POA: Diagnosis not present

## 2018-06-24 DIAGNOSIS — R1311 Dysphagia, oral phase: Secondary | ICD-10-CM | POA: Diagnosis not present

## 2018-06-24 DIAGNOSIS — U071 COVID-19: Secondary | ICD-10-CM | POA: Diagnosis not present

## 2018-06-28 DIAGNOSIS — U071 COVID-19: Secondary | ICD-10-CM | POA: Diagnosis not present

## 2018-06-28 DIAGNOSIS — G309 Alzheimer's disease, unspecified: Secondary | ICD-10-CM | POA: Diagnosis not present

## 2018-06-28 DIAGNOSIS — R1311 Dysphagia, oral phase: Secondary | ICD-10-CM | POA: Diagnosis not present

## 2018-06-28 DIAGNOSIS — R1314 Dysphagia, pharyngoesophageal phase: Secondary | ICD-10-CM | POA: Diagnosis not present

## 2018-06-28 DIAGNOSIS — R293 Abnormal posture: Secondary | ICD-10-CM | POA: Diagnosis not present

## 2018-06-29 DIAGNOSIS — R1311 Dysphagia, oral phase: Secondary | ICD-10-CM | POA: Diagnosis not present

## 2018-06-29 DIAGNOSIS — G309 Alzheimer's disease, unspecified: Secondary | ICD-10-CM | POA: Diagnosis not present

## 2018-06-29 DIAGNOSIS — U071 COVID-19: Secondary | ICD-10-CM | POA: Diagnosis not present

## 2018-06-29 DIAGNOSIS — R293 Abnormal posture: Secondary | ICD-10-CM | POA: Diagnosis not present

## 2018-06-29 DIAGNOSIS — R1314 Dysphagia, pharyngoesophageal phase: Secondary | ICD-10-CM | POA: Diagnosis not present

## 2018-07-02 DIAGNOSIS — R1311 Dysphagia, oral phase: Secondary | ICD-10-CM | POA: Diagnosis not present

## 2018-07-02 DIAGNOSIS — R1314 Dysphagia, pharyngoesophageal phase: Secondary | ICD-10-CM | POA: Diagnosis not present

## 2018-07-02 DIAGNOSIS — G309 Alzheimer's disease, unspecified: Secondary | ICD-10-CM | POA: Diagnosis not present

## 2018-07-02 DIAGNOSIS — R293 Abnormal posture: Secondary | ICD-10-CM | POA: Diagnosis not present

## 2018-07-02 DIAGNOSIS — U071 COVID-19: Secondary | ICD-10-CM | POA: Diagnosis not present

## 2018-07-05 DIAGNOSIS — I739 Peripheral vascular disease, unspecified: Secondary | ICD-10-CM | POA: Diagnosis not present

## 2018-07-05 DIAGNOSIS — B351 Tinea unguium: Secondary | ICD-10-CM | POA: Diagnosis not present

## 2018-07-06 DIAGNOSIS — G309 Alzheimer's disease, unspecified: Secondary | ICD-10-CM | POA: Diagnosis not present

## 2018-07-06 DIAGNOSIS — R1311 Dysphagia, oral phase: Secondary | ICD-10-CM | POA: Diagnosis not present

## 2018-07-06 DIAGNOSIS — U071 COVID-19: Secondary | ICD-10-CM | POA: Diagnosis not present

## 2018-07-06 DIAGNOSIS — R293 Abnormal posture: Secondary | ICD-10-CM | POA: Diagnosis not present

## 2018-07-06 DIAGNOSIS — R1314 Dysphagia, pharyngoesophageal phase: Secondary | ICD-10-CM | POA: Diagnosis not present

## 2018-07-09 DIAGNOSIS — R293 Abnormal posture: Secondary | ICD-10-CM | POA: Diagnosis not present

## 2018-07-09 DIAGNOSIS — R1314 Dysphagia, pharyngoesophageal phase: Secondary | ICD-10-CM | POA: Diagnosis not present

## 2018-07-09 DIAGNOSIS — U071 COVID-19: Secondary | ICD-10-CM | POA: Diagnosis not present

## 2018-07-09 DIAGNOSIS — G309 Alzheimer's disease, unspecified: Secondary | ICD-10-CM | POA: Diagnosis not present

## 2018-07-09 DIAGNOSIS — R1311 Dysphagia, oral phase: Secondary | ICD-10-CM | POA: Diagnosis not present

## 2018-07-14 DIAGNOSIS — K219 Gastro-esophageal reflux disease without esophagitis: Secondary | ICD-10-CM | POA: Diagnosis not present

## 2018-07-14 DIAGNOSIS — U071 COVID-19: Secondary | ICD-10-CM | POA: Diagnosis not present

## 2018-07-14 DIAGNOSIS — R1312 Dysphagia, oropharyngeal phase: Secondary | ICD-10-CM | POA: Diagnosis not present

## 2018-07-14 DIAGNOSIS — G309 Alzheimer's disease, unspecified: Secondary | ICD-10-CM | POA: Diagnosis not present

## 2018-07-14 DIAGNOSIS — R293 Abnormal posture: Secondary | ICD-10-CM | POA: Diagnosis not present

## 2018-07-16 DIAGNOSIS — R1312 Dysphagia, oropharyngeal phase: Secondary | ICD-10-CM | POA: Diagnosis not present

## 2018-07-16 DIAGNOSIS — R293 Abnormal posture: Secondary | ICD-10-CM | POA: Diagnosis not present

## 2018-07-16 DIAGNOSIS — G309 Alzheimer's disease, unspecified: Secondary | ICD-10-CM | POA: Diagnosis not present

## 2018-07-16 DIAGNOSIS — U071 COVID-19: Secondary | ICD-10-CM | POA: Diagnosis not present

## 2018-07-16 DIAGNOSIS — K219 Gastro-esophageal reflux disease without esophagitis: Secondary | ICD-10-CM | POA: Diagnosis not present

## 2018-07-20 DIAGNOSIS — U071 COVID-19: Secondary | ICD-10-CM | POA: Diagnosis not present

## 2018-07-20 DIAGNOSIS — G309 Alzheimer's disease, unspecified: Secondary | ICD-10-CM | POA: Diagnosis not present

## 2018-07-20 DIAGNOSIS — K219 Gastro-esophageal reflux disease without esophagitis: Secondary | ICD-10-CM | POA: Diagnosis not present

## 2018-07-20 DIAGNOSIS — R293 Abnormal posture: Secondary | ICD-10-CM | POA: Diagnosis not present

## 2018-07-20 DIAGNOSIS — R1312 Dysphagia, oropharyngeal phase: Secondary | ICD-10-CM | POA: Diagnosis not present

## 2018-07-21 DIAGNOSIS — R319 Hematuria, unspecified: Secondary | ICD-10-CM | POA: Diagnosis not present

## 2018-07-21 DIAGNOSIS — I10 Essential (primary) hypertension: Secondary | ICD-10-CM | POA: Diagnosis not present

## 2018-07-21 DIAGNOSIS — E039 Hypothyroidism, unspecified: Secondary | ICD-10-CM | POA: Diagnosis not present

## 2018-07-21 DIAGNOSIS — N390 Urinary tract infection, site not specified: Secondary | ICD-10-CM | POA: Diagnosis not present

## 2018-07-21 DIAGNOSIS — E119 Type 2 diabetes mellitus without complications: Secondary | ICD-10-CM | POA: Diagnosis not present

## 2018-07-21 DIAGNOSIS — Z79899 Other long term (current) drug therapy: Secondary | ICD-10-CM | POA: Diagnosis not present

## 2018-07-21 DIAGNOSIS — F411 Generalized anxiety disorder: Secondary | ICD-10-CM | POA: Diagnosis not present

## 2018-07-21 DIAGNOSIS — D649 Anemia, unspecified: Secondary | ICD-10-CM | POA: Diagnosis not present

## 2018-07-23 DIAGNOSIS — U071 COVID-19: Secondary | ICD-10-CM | POA: Diagnosis not present

## 2018-07-23 DIAGNOSIS — K219 Gastro-esophageal reflux disease without esophagitis: Secondary | ICD-10-CM | POA: Diagnosis not present

## 2018-07-23 DIAGNOSIS — R293 Abnormal posture: Secondary | ICD-10-CM | POA: Diagnosis not present

## 2018-07-23 DIAGNOSIS — R1312 Dysphagia, oropharyngeal phase: Secondary | ICD-10-CM | POA: Diagnosis not present

## 2018-07-23 DIAGNOSIS — G309 Alzheimer's disease, unspecified: Secondary | ICD-10-CM | POA: Diagnosis not present

## 2018-07-27 DIAGNOSIS — R1312 Dysphagia, oropharyngeal phase: Secondary | ICD-10-CM | POA: Diagnosis not present

## 2018-07-27 DIAGNOSIS — R293 Abnormal posture: Secondary | ICD-10-CM | POA: Diagnosis not present

## 2018-07-27 DIAGNOSIS — G309 Alzheimer's disease, unspecified: Secondary | ICD-10-CM | POA: Diagnosis not present

## 2018-07-27 DIAGNOSIS — U071 COVID-19: Secondary | ICD-10-CM | POA: Diagnosis not present

## 2018-07-27 DIAGNOSIS — K219 Gastro-esophageal reflux disease without esophagitis: Secondary | ICD-10-CM | POA: Diagnosis not present

## 2018-08-04 DIAGNOSIS — F39 Unspecified mood [affective] disorder: Secondary | ICD-10-CM | POA: Diagnosis not present

## 2018-08-04 DIAGNOSIS — F411 Generalized anxiety disorder: Secondary | ICD-10-CM | POA: Diagnosis not present

## 2018-08-09 DIAGNOSIS — R6889 Other general symptoms and signs: Secondary | ICD-10-CM | POA: Diagnosis not present

## 2018-08-10 DIAGNOSIS — K219 Gastro-esophageal reflux disease without esophagitis: Secondary | ICD-10-CM | POA: Diagnosis not present

## 2018-08-10 DIAGNOSIS — R1312 Dysphagia, oropharyngeal phase: Secondary | ICD-10-CM | POA: Diagnosis not present

## 2018-08-10 DIAGNOSIS — U071 COVID-19: Secondary | ICD-10-CM | POA: Diagnosis not present

## 2018-08-10 DIAGNOSIS — G309 Alzheimer's disease, unspecified: Secondary | ICD-10-CM | POA: Diagnosis not present

## 2018-08-10 DIAGNOSIS — R293 Abnormal posture: Secondary | ICD-10-CM | POA: Diagnosis not present

## 2018-08-13 ENCOUNTER — Other Ambulatory Visit: Payer: Self-pay

## 2018-08-17 DIAGNOSIS — K219 Gastro-esophageal reflux disease without esophagitis: Secondary | ICD-10-CM | POA: Diagnosis not present

## 2018-08-17 DIAGNOSIS — R1312 Dysphagia, oropharyngeal phase: Secondary | ICD-10-CM | POA: Diagnosis not present

## 2018-08-18 DIAGNOSIS — R1312 Dysphagia, oropharyngeal phase: Secondary | ICD-10-CM | POA: Diagnosis not present

## 2018-08-18 DIAGNOSIS — K219 Gastro-esophageal reflux disease without esophagitis: Secondary | ICD-10-CM | POA: Diagnosis not present

## 2018-08-19 DIAGNOSIS — K219 Gastro-esophageal reflux disease without esophagitis: Secondary | ICD-10-CM | POA: Diagnosis not present

## 2018-08-19 DIAGNOSIS — R1312 Dysphagia, oropharyngeal phase: Secondary | ICD-10-CM | POA: Diagnosis not present

## 2018-08-20 DIAGNOSIS — K219 Gastro-esophageal reflux disease without esophagitis: Secondary | ICD-10-CM | POA: Diagnosis not present

## 2018-08-20 DIAGNOSIS — R1312 Dysphagia, oropharyngeal phase: Secondary | ICD-10-CM | POA: Diagnosis not present

## 2018-08-23 DIAGNOSIS — R1312 Dysphagia, oropharyngeal phase: Secondary | ICD-10-CM | POA: Diagnosis not present

## 2018-08-23 DIAGNOSIS — K219 Gastro-esophageal reflux disease without esophagitis: Secondary | ICD-10-CM | POA: Diagnosis not present

## 2018-09-01 DIAGNOSIS — F39 Unspecified mood [affective] disorder: Secondary | ICD-10-CM | POA: Diagnosis not present

## 2018-09-01 DIAGNOSIS — F411 Generalized anxiety disorder: Secondary | ICD-10-CM | POA: Diagnosis not present

## 2018-09-15 DIAGNOSIS — Z20828 Contact with and (suspected) exposure to other viral communicable diseases: Secondary | ICD-10-CM | POA: Diagnosis not present

## 2018-09-22 DIAGNOSIS — Z20828 Contact with and (suspected) exposure to other viral communicable diseases: Secondary | ICD-10-CM | POA: Diagnosis not present

## 2018-09-26 DIAGNOSIS — I10 Essential (primary) hypertension: Secondary | ICD-10-CM | POA: Diagnosis not present

## 2018-09-26 DIAGNOSIS — R1310 Dysphagia, unspecified: Secondary | ICD-10-CM | POA: Diagnosis not present

## 2018-09-26 DIAGNOSIS — G309 Alzheimer's disease, unspecified: Secondary | ICD-10-CM | POA: Diagnosis not present

## 2018-10-11 DIAGNOSIS — R05 Cough: Secondary | ICD-10-CM | POA: Diagnosis not present

## 2018-10-11 DIAGNOSIS — Z20828 Contact with and (suspected) exposure to other viral communicable diseases: Secondary | ICD-10-CM | POA: Diagnosis not present

## 2018-10-14 DIAGNOSIS — I739 Peripheral vascular disease, unspecified: Secondary | ICD-10-CM | POA: Diagnosis not present

## 2018-10-14 DIAGNOSIS — B351 Tinea unguium: Secondary | ICD-10-CM | POA: Diagnosis not present

## 2018-10-27 DIAGNOSIS — Z20828 Contact with and (suspected) exposure to other viral communicable diseases: Secondary | ICD-10-CM | POA: Diagnosis not present

## 2018-11-02 DIAGNOSIS — Z20828 Contact with and (suspected) exposure to other viral communicable diseases: Secondary | ICD-10-CM | POA: Diagnosis not present

## 2018-11-18 DIAGNOSIS — G309 Alzheimer's disease, unspecified: Secondary | ICD-10-CM | POA: Diagnosis not present

## 2018-11-18 DIAGNOSIS — E119 Type 2 diabetes mellitus without complications: Secondary | ICD-10-CM | POA: Diagnosis not present

## 2018-11-18 DIAGNOSIS — Z961 Presence of intraocular lens: Secondary | ICD-10-CM | POA: Diagnosis not present

## 2018-11-22 DIAGNOSIS — D649 Anemia, unspecified: Secondary | ICD-10-CM | POA: Diagnosis not present

## 2018-11-22 DIAGNOSIS — I10 Essential (primary) hypertension: Secondary | ICD-10-CM | POA: Diagnosis not present

## 2018-11-22 DIAGNOSIS — N390 Urinary tract infection, site not specified: Secondary | ICD-10-CM | POA: Diagnosis not present

## 2018-11-22 DIAGNOSIS — E119 Type 2 diabetes mellitus without complications: Secondary | ICD-10-CM | POA: Diagnosis not present

## 2018-11-22 DIAGNOSIS — E039 Hypothyroidism, unspecified: Secondary | ICD-10-CM | POA: Diagnosis not present

## 2018-11-22 DIAGNOSIS — E785 Hyperlipidemia, unspecified: Secondary | ICD-10-CM | POA: Diagnosis not present

## 2018-11-22 DIAGNOSIS — Z79899 Other long term (current) drug therapy: Secondary | ICD-10-CM | POA: Diagnosis not present

## 2018-11-24 DIAGNOSIS — F39 Unspecified mood [affective] disorder: Secondary | ICD-10-CM | POA: Diagnosis not present

## 2018-11-24 DIAGNOSIS — F411 Generalized anxiety disorder: Secondary | ICD-10-CM | POA: Diagnosis not present

## 2018-11-26 DIAGNOSIS — F411 Generalized anxiety disorder: Secondary | ICD-10-CM | POA: Diagnosis not present

## 2018-12-08 ENCOUNTER — Encounter: Payer: Self-pay | Admitting: Internal Medicine

## 2018-12-08 ENCOUNTER — Other Ambulatory Visit: Payer: Self-pay

## 2018-12-13 DIAGNOSIS — Z20828 Contact with and (suspected) exposure to other viral communicable diseases: Secondary | ICD-10-CM | POA: Diagnosis not present

## 2018-12-20 DIAGNOSIS — Z20828 Contact with and (suspected) exposure to other viral communicable diseases: Secondary | ICD-10-CM | POA: Diagnosis not present

## 2019-01-10 DIAGNOSIS — Z23 Encounter for immunization: Secondary | ICD-10-CM | POA: Diagnosis not present

## 2019-02-07 DIAGNOSIS — Z23 Encounter for immunization: Secondary | ICD-10-CM | POA: Diagnosis not present

## 2019-02-16 DIAGNOSIS — F39 Unspecified mood [affective] disorder: Secondary | ICD-10-CM | POA: Diagnosis not present

## 2019-02-16 DIAGNOSIS — F411 Generalized anxiety disorder: Secondary | ICD-10-CM | POA: Diagnosis not present

## 2019-02-17 DIAGNOSIS — L602 Onychogryphosis: Secondary | ICD-10-CM | POA: Diagnosis not present

## 2019-02-17 DIAGNOSIS — I739 Peripheral vascular disease, unspecified: Secondary | ICD-10-CM | POA: Diagnosis not present

## 2019-02-19 DIAGNOSIS — L299 Pruritus, unspecified: Secondary | ICD-10-CM | POA: Diagnosis not present

## 2019-03-15 DIAGNOSIS — D649 Anemia, unspecified: Secondary | ICD-10-CM | POA: Diagnosis not present

## 2019-03-15 DIAGNOSIS — I10 Essential (primary) hypertension: Secondary | ICD-10-CM | POA: Diagnosis not present

## 2019-03-15 DIAGNOSIS — R569 Unspecified convulsions: Secondary | ICD-10-CM | POA: Diagnosis not present

## 2019-03-15 DIAGNOSIS — E119 Type 2 diabetes mellitus without complications: Secondary | ICD-10-CM | POA: Diagnosis not present

## 2019-03-15 DIAGNOSIS — E039 Hypothyroidism, unspecified: Secondary | ICD-10-CM | POA: Diagnosis not present

## 2019-03-15 DIAGNOSIS — N390 Urinary tract infection, site not specified: Secondary | ICD-10-CM | POA: Diagnosis not present

## 2019-03-15 DIAGNOSIS — Z79899 Other long term (current) drug therapy: Secondary | ICD-10-CM | POA: Diagnosis not present

## 2019-03-15 DIAGNOSIS — E785 Hyperlipidemia, unspecified: Secondary | ICD-10-CM | POA: Diagnosis not present

## 2019-03-16 DIAGNOSIS — F0391 Unspecified dementia with behavioral disturbance: Secondary | ICD-10-CM | POA: Diagnosis not present

## 2019-03-16 DIAGNOSIS — G309 Alzheimer's disease, unspecified: Secondary | ICD-10-CM | POA: Diagnosis not present

## 2019-03-16 DIAGNOSIS — F411 Generalized anxiety disorder: Secondary | ICD-10-CM | POA: Diagnosis not present

## 2019-05-03 DIAGNOSIS — L602 Onychogryphosis: Secondary | ICD-10-CM | POA: Diagnosis not present

## 2019-05-03 DIAGNOSIS — I739 Peripheral vascular disease, unspecified: Secondary | ICD-10-CM | POA: Diagnosis not present

## 2019-05-15 DIAGNOSIS — R918 Other nonspecific abnormal finding of lung field: Secondary | ICD-10-CM | POA: Diagnosis not present

## 2019-05-15 DIAGNOSIS — M25512 Pain in left shoulder: Secondary | ICD-10-CM | POA: Diagnosis not present

## 2019-05-16 DIAGNOSIS — M25512 Pain in left shoulder: Secondary | ICD-10-CM | POA: Diagnosis not present

## 2019-05-16 DIAGNOSIS — R6889 Other general symptoms and signs: Secondary | ICD-10-CM | POA: Diagnosis not present

## 2019-05-17 DIAGNOSIS — M25551 Pain in right hip: Secondary | ICD-10-CM | POA: Diagnosis not present

## 2019-05-17 DIAGNOSIS — R1314 Dysphagia, pharyngoesophageal phase: Secondary | ICD-10-CM | POA: Diagnosis not present

## 2019-05-17 DIAGNOSIS — K219 Gastro-esophageal reflux disease without esophagitis: Secondary | ICD-10-CM | POA: Diagnosis not present

## 2019-05-17 DIAGNOSIS — R2681 Unsteadiness on feet: Secondary | ICD-10-CM | POA: Diagnosis not present

## 2019-05-17 DIAGNOSIS — R1312 Dysphagia, oropharyngeal phase: Secondary | ICD-10-CM | POA: Diagnosis not present

## 2019-05-17 DIAGNOSIS — G309 Alzheimer's disease, unspecified: Secondary | ICD-10-CM | POA: Diagnosis not present

## 2019-05-17 DIAGNOSIS — R293 Abnormal posture: Secondary | ICD-10-CM | POA: Diagnosis not present

## 2019-05-17 DIAGNOSIS — M6281 Muscle weakness (generalized): Secondary | ICD-10-CM | POA: Diagnosis not present

## 2019-05-18 DIAGNOSIS — M6281 Muscle weakness (generalized): Secondary | ICD-10-CM | POA: Diagnosis not present

## 2019-05-18 DIAGNOSIS — R1312 Dysphagia, oropharyngeal phase: Secondary | ICD-10-CM | POA: Diagnosis not present

## 2019-05-18 DIAGNOSIS — G309 Alzheimer's disease, unspecified: Secondary | ICD-10-CM | POA: Diagnosis not present

## 2019-05-18 DIAGNOSIS — M25551 Pain in right hip: Secondary | ICD-10-CM | POA: Diagnosis not present

## 2019-05-18 DIAGNOSIS — R1314 Dysphagia, pharyngoesophageal phase: Secondary | ICD-10-CM | POA: Diagnosis not present

## 2019-05-18 DIAGNOSIS — K219 Gastro-esophageal reflux disease without esophagitis: Secondary | ICD-10-CM | POA: Diagnosis not present

## 2019-05-19 DIAGNOSIS — R1312 Dysphagia, oropharyngeal phase: Secondary | ICD-10-CM | POA: Diagnosis not present

## 2019-05-19 DIAGNOSIS — R1314 Dysphagia, pharyngoesophageal phase: Secondary | ICD-10-CM | POA: Diagnosis not present

## 2019-05-19 DIAGNOSIS — M25551 Pain in right hip: Secondary | ICD-10-CM | POA: Diagnosis not present

## 2019-05-19 DIAGNOSIS — M6281 Muscle weakness (generalized): Secondary | ICD-10-CM | POA: Diagnosis not present

## 2019-05-19 DIAGNOSIS — G309 Alzheimer's disease, unspecified: Secondary | ICD-10-CM | POA: Diagnosis not present

## 2019-05-19 DIAGNOSIS — K219 Gastro-esophageal reflux disease without esophagitis: Secondary | ICD-10-CM | POA: Diagnosis not present

## 2019-05-20 DIAGNOSIS — M25551 Pain in right hip: Secondary | ICD-10-CM | POA: Diagnosis not present

## 2019-05-20 DIAGNOSIS — R1312 Dysphagia, oropharyngeal phase: Secondary | ICD-10-CM | POA: Diagnosis not present

## 2019-05-20 DIAGNOSIS — K219 Gastro-esophageal reflux disease without esophagitis: Secondary | ICD-10-CM | POA: Diagnosis not present

## 2019-05-20 DIAGNOSIS — M6281 Muscle weakness (generalized): Secondary | ICD-10-CM | POA: Diagnosis not present

## 2019-05-20 DIAGNOSIS — G309 Alzheimer's disease, unspecified: Secondary | ICD-10-CM | POA: Diagnosis not present

## 2019-05-20 DIAGNOSIS — R1314 Dysphagia, pharyngoesophageal phase: Secondary | ICD-10-CM | POA: Diagnosis not present

## 2019-05-23 DIAGNOSIS — R1312 Dysphagia, oropharyngeal phase: Secondary | ICD-10-CM | POA: Diagnosis not present

## 2019-05-23 DIAGNOSIS — K219 Gastro-esophageal reflux disease without esophagitis: Secondary | ICD-10-CM | POA: Diagnosis not present

## 2019-05-23 DIAGNOSIS — M6281 Muscle weakness (generalized): Secondary | ICD-10-CM | POA: Diagnosis not present

## 2019-05-23 DIAGNOSIS — M25551 Pain in right hip: Secondary | ICD-10-CM | POA: Diagnosis not present

## 2019-05-23 DIAGNOSIS — R1314 Dysphagia, pharyngoesophageal phase: Secondary | ICD-10-CM | POA: Diagnosis not present

## 2019-05-23 DIAGNOSIS — G309 Alzheimer's disease, unspecified: Secondary | ICD-10-CM | POA: Diagnosis not present

## 2019-05-25 DIAGNOSIS — M6281 Muscle weakness (generalized): Secondary | ICD-10-CM | POA: Diagnosis not present

## 2019-05-25 DIAGNOSIS — G309 Alzheimer's disease, unspecified: Secondary | ICD-10-CM | POA: Diagnosis not present

## 2019-05-25 DIAGNOSIS — K219 Gastro-esophageal reflux disease without esophagitis: Secondary | ICD-10-CM | POA: Diagnosis not present

## 2019-05-25 DIAGNOSIS — M25551 Pain in right hip: Secondary | ICD-10-CM | POA: Diagnosis not present

## 2019-05-25 DIAGNOSIS — R1312 Dysphagia, oropharyngeal phase: Secondary | ICD-10-CM | POA: Diagnosis not present

## 2019-05-25 DIAGNOSIS — R1314 Dysphagia, pharyngoesophageal phase: Secondary | ICD-10-CM | POA: Diagnosis not present

## 2019-05-26 DIAGNOSIS — M6281 Muscle weakness (generalized): Secondary | ICD-10-CM | POA: Diagnosis not present

## 2019-05-26 DIAGNOSIS — G309 Alzheimer's disease, unspecified: Secondary | ICD-10-CM | POA: Diagnosis not present

## 2019-05-26 DIAGNOSIS — M25551 Pain in right hip: Secondary | ICD-10-CM | POA: Diagnosis not present

## 2019-05-26 DIAGNOSIS — K219 Gastro-esophageal reflux disease without esophagitis: Secondary | ICD-10-CM | POA: Diagnosis not present

## 2019-05-26 DIAGNOSIS — R1314 Dysphagia, pharyngoesophageal phase: Secondary | ICD-10-CM | POA: Diagnosis not present

## 2019-05-26 DIAGNOSIS — R1312 Dysphagia, oropharyngeal phase: Secondary | ICD-10-CM | POA: Diagnosis not present

## 2019-05-30 DIAGNOSIS — M25551 Pain in right hip: Secondary | ICD-10-CM | POA: Diagnosis not present

## 2019-05-30 DIAGNOSIS — R1314 Dysphagia, pharyngoesophageal phase: Secondary | ICD-10-CM | POA: Diagnosis not present

## 2019-05-30 DIAGNOSIS — K219 Gastro-esophageal reflux disease without esophagitis: Secondary | ICD-10-CM | POA: Diagnosis not present

## 2019-05-30 DIAGNOSIS — R1312 Dysphagia, oropharyngeal phase: Secondary | ICD-10-CM | POA: Diagnosis not present

## 2019-05-30 DIAGNOSIS — M6281 Muscle weakness (generalized): Secondary | ICD-10-CM | POA: Diagnosis not present

## 2019-05-30 DIAGNOSIS — G309 Alzheimer's disease, unspecified: Secondary | ICD-10-CM | POA: Diagnosis not present

## 2019-06-01 DIAGNOSIS — M25551 Pain in right hip: Secondary | ICD-10-CM | POA: Diagnosis not present

## 2019-06-01 DIAGNOSIS — M6281 Muscle weakness (generalized): Secondary | ICD-10-CM | POA: Diagnosis not present

## 2019-06-01 DIAGNOSIS — R1312 Dysphagia, oropharyngeal phase: Secondary | ICD-10-CM | POA: Diagnosis not present

## 2019-06-01 DIAGNOSIS — G309 Alzheimer's disease, unspecified: Secondary | ICD-10-CM | POA: Diagnosis not present

## 2019-06-01 DIAGNOSIS — K219 Gastro-esophageal reflux disease without esophagitis: Secondary | ICD-10-CM | POA: Diagnosis not present

## 2019-06-01 DIAGNOSIS — R05 Cough: Secondary | ICD-10-CM | POA: Diagnosis not present

## 2019-06-01 DIAGNOSIS — R1314 Dysphagia, pharyngoesophageal phase: Secondary | ICD-10-CM | POA: Diagnosis not present

## 2019-06-02 DIAGNOSIS — M25551 Pain in right hip: Secondary | ICD-10-CM | POA: Diagnosis not present

## 2019-06-02 DIAGNOSIS — R1314 Dysphagia, pharyngoesophageal phase: Secondary | ICD-10-CM | POA: Diagnosis not present

## 2019-06-02 DIAGNOSIS — G309 Alzheimer's disease, unspecified: Secondary | ICD-10-CM | POA: Diagnosis not present

## 2019-06-02 DIAGNOSIS — R1312 Dysphagia, oropharyngeal phase: Secondary | ICD-10-CM | POA: Diagnosis not present

## 2019-06-02 DIAGNOSIS — M6281 Muscle weakness (generalized): Secondary | ICD-10-CM | POA: Diagnosis not present

## 2019-06-02 DIAGNOSIS — K219 Gastro-esophageal reflux disease without esophagitis: Secondary | ICD-10-CM | POA: Diagnosis not present

## 2019-06-06 DIAGNOSIS — M25551 Pain in right hip: Secondary | ICD-10-CM | POA: Diagnosis not present

## 2019-06-06 DIAGNOSIS — M6281 Muscle weakness (generalized): Secondary | ICD-10-CM | POA: Diagnosis not present

## 2019-06-06 DIAGNOSIS — G309 Alzheimer's disease, unspecified: Secondary | ICD-10-CM | POA: Diagnosis not present

## 2019-06-06 DIAGNOSIS — K219 Gastro-esophageal reflux disease without esophagitis: Secondary | ICD-10-CM | POA: Diagnosis not present

## 2019-06-06 DIAGNOSIS — R1312 Dysphagia, oropharyngeal phase: Secondary | ICD-10-CM | POA: Diagnosis not present

## 2019-06-06 DIAGNOSIS — R1314 Dysphagia, pharyngoesophageal phase: Secondary | ICD-10-CM | POA: Diagnosis not present

## 2019-06-08 DIAGNOSIS — G309 Alzheimer's disease, unspecified: Secondary | ICD-10-CM | POA: Diagnosis not present

## 2019-06-08 DIAGNOSIS — R1312 Dysphagia, oropharyngeal phase: Secondary | ICD-10-CM | POA: Diagnosis not present

## 2019-06-08 DIAGNOSIS — M6281 Muscle weakness (generalized): Secondary | ICD-10-CM | POA: Diagnosis not present

## 2019-06-08 DIAGNOSIS — M25551 Pain in right hip: Secondary | ICD-10-CM | POA: Diagnosis not present

## 2019-06-08 DIAGNOSIS — R1314 Dysphagia, pharyngoesophageal phase: Secondary | ICD-10-CM | POA: Diagnosis not present

## 2019-06-08 DIAGNOSIS — K219 Gastro-esophageal reflux disease without esophagitis: Secondary | ICD-10-CM | POA: Diagnosis not present

## 2019-06-09 DIAGNOSIS — R1312 Dysphagia, oropharyngeal phase: Secondary | ICD-10-CM | POA: Diagnosis not present

## 2019-06-09 DIAGNOSIS — M6281 Muscle weakness (generalized): Secondary | ICD-10-CM | POA: Diagnosis not present

## 2019-06-09 DIAGNOSIS — R1314 Dysphagia, pharyngoesophageal phase: Secondary | ICD-10-CM | POA: Diagnosis not present

## 2019-06-09 DIAGNOSIS — G309 Alzheimer's disease, unspecified: Secondary | ICD-10-CM | POA: Diagnosis not present

## 2019-06-09 DIAGNOSIS — K219 Gastro-esophageal reflux disease without esophagitis: Secondary | ICD-10-CM | POA: Diagnosis not present

## 2019-06-09 DIAGNOSIS — M25551 Pain in right hip: Secondary | ICD-10-CM | POA: Diagnosis not present

## 2019-06-13 DIAGNOSIS — G309 Alzheimer's disease, unspecified: Secondary | ICD-10-CM | POA: Diagnosis not present

## 2019-06-13 DIAGNOSIS — R1314 Dysphagia, pharyngoesophageal phase: Secondary | ICD-10-CM | POA: Diagnosis not present

## 2019-06-13 DIAGNOSIS — M6281 Muscle weakness (generalized): Secondary | ICD-10-CM | POA: Diagnosis not present

## 2019-06-13 DIAGNOSIS — R1312 Dysphagia, oropharyngeal phase: Secondary | ICD-10-CM | POA: Diagnosis not present

## 2019-06-13 DIAGNOSIS — M25551 Pain in right hip: Secondary | ICD-10-CM | POA: Diagnosis not present

## 2019-06-13 DIAGNOSIS — K219 Gastro-esophageal reflux disease without esophagitis: Secondary | ICD-10-CM | POA: Diagnosis not present

## 2019-06-15 DIAGNOSIS — G309 Alzheimer's disease, unspecified: Secondary | ICD-10-CM | POA: Diagnosis not present

## 2019-06-15 DIAGNOSIS — F0391 Unspecified dementia with behavioral disturbance: Secondary | ICD-10-CM | POA: Diagnosis not present

## 2019-06-15 DIAGNOSIS — F411 Generalized anxiety disorder: Secondary | ICD-10-CM | POA: Diagnosis not present

## 2019-07-01 DIAGNOSIS — F39 Unspecified mood [affective] disorder: Secondary | ICD-10-CM | POA: Diagnosis not present

## 2019-07-01 DIAGNOSIS — F0391 Unspecified dementia with behavioral disturbance: Secondary | ICD-10-CM | POA: Diagnosis not present

## 2019-07-01 DIAGNOSIS — F411 Generalized anxiety disorder: Secondary | ICD-10-CM | POA: Diagnosis not present

## 2019-07-01 DIAGNOSIS — F0280 Dementia in other diseases classified elsewhere without behavioral disturbance: Secondary | ICD-10-CM | POA: Diagnosis not present

## 2019-07-01 DIAGNOSIS — G309 Alzheimer's disease, unspecified: Secondary | ICD-10-CM | POA: Diagnosis not present

## 2019-07-15 DIAGNOSIS — F0280 Dementia in other diseases classified elsewhere without behavioral disturbance: Secondary | ICD-10-CM | POA: Diagnosis not present

## 2019-07-15 DIAGNOSIS — F39 Unspecified mood [affective] disorder: Secondary | ICD-10-CM | POA: Diagnosis not present

## 2019-07-15 DIAGNOSIS — F411 Generalized anxiety disorder: Secondary | ICD-10-CM | POA: Diagnosis not present

## 2019-07-15 DIAGNOSIS — G309 Alzheimer's disease, unspecified: Secondary | ICD-10-CM | POA: Diagnosis not present

## 2019-07-15 DIAGNOSIS — F0391 Unspecified dementia with behavioral disturbance: Secondary | ICD-10-CM | POA: Diagnosis not present

## 2019-07-22 DIAGNOSIS — F39 Unspecified mood [affective] disorder: Secondary | ICD-10-CM | POA: Diagnosis not present

## 2019-07-22 DIAGNOSIS — G309 Alzheimer's disease, unspecified: Secondary | ICD-10-CM | POA: Diagnosis not present

## 2019-07-22 DIAGNOSIS — F411 Generalized anxiety disorder: Secondary | ICD-10-CM | POA: Diagnosis not present

## 2019-07-22 DIAGNOSIS — F0391 Unspecified dementia with behavioral disturbance: Secondary | ICD-10-CM | POA: Diagnosis not present

## 2019-07-27 DIAGNOSIS — G309 Alzheimer's disease, unspecified: Secondary | ICD-10-CM | POA: Diagnosis not present

## 2019-07-27 DIAGNOSIS — F0391 Unspecified dementia with behavioral disturbance: Secondary | ICD-10-CM | POA: Diagnosis not present

## 2019-07-27 DIAGNOSIS — F411 Generalized anxiety disorder: Secondary | ICD-10-CM | POA: Diagnosis not present

## 2019-07-27 DIAGNOSIS — F39 Unspecified mood [affective] disorder: Secondary | ICD-10-CM | POA: Diagnosis not present

## 2019-07-29 DIAGNOSIS — G309 Alzheimer's disease, unspecified: Secondary | ICD-10-CM | POA: Diagnosis not present

## 2019-07-29 DIAGNOSIS — F411 Generalized anxiety disorder: Secondary | ICD-10-CM | POA: Diagnosis not present

## 2019-07-29 DIAGNOSIS — F0280 Dementia in other diseases classified elsewhere without behavioral disturbance: Secondary | ICD-10-CM | POA: Diagnosis not present

## 2019-07-29 DIAGNOSIS — F39 Unspecified mood [affective] disorder: Secondary | ICD-10-CM | POA: Diagnosis not present

## 2019-07-29 DIAGNOSIS — F0391 Unspecified dementia with behavioral disturbance: Secondary | ICD-10-CM | POA: Diagnosis not present

## 2019-08-05 DIAGNOSIS — F0391 Unspecified dementia with behavioral disturbance: Secondary | ICD-10-CM | POA: Diagnosis not present

## 2019-08-05 DIAGNOSIS — G309 Alzheimer's disease, unspecified: Secondary | ICD-10-CM | POA: Diagnosis not present

## 2019-08-05 DIAGNOSIS — F0280 Dementia in other diseases classified elsewhere without behavioral disturbance: Secondary | ICD-10-CM | POA: Diagnosis not present

## 2019-08-05 DIAGNOSIS — F39 Unspecified mood [affective] disorder: Secondary | ICD-10-CM | POA: Diagnosis not present

## 2019-08-05 DIAGNOSIS — F411 Generalized anxiety disorder: Secondary | ICD-10-CM | POA: Diagnosis not present

## 2019-08-11 DIAGNOSIS — R05 Cough: Secondary | ICD-10-CM | POA: Diagnosis not present

## 2019-08-12 DIAGNOSIS — F39 Unspecified mood [affective] disorder: Secondary | ICD-10-CM | POA: Diagnosis not present

## 2019-08-12 DIAGNOSIS — F0280 Dementia in other diseases classified elsewhere without behavioral disturbance: Secondary | ICD-10-CM | POA: Diagnosis not present

## 2019-08-12 DIAGNOSIS — G309 Alzheimer's disease, unspecified: Secondary | ICD-10-CM | POA: Diagnosis not present

## 2019-08-12 DIAGNOSIS — F411 Generalized anxiety disorder: Secondary | ICD-10-CM | POA: Diagnosis not present

## 2019-08-12 DIAGNOSIS — F0391 Unspecified dementia with behavioral disturbance: Secondary | ICD-10-CM | POA: Diagnosis not present

## 2019-08-17 DIAGNOSIS — G309 Alzheimer's disease, unspecified: Secondary | ICD-10-CM | POA: Diagnosis not present

## 2019-08-17 DIAGNOSIS — F39 Unspecified mood [affective] disorder: Secondary | ICD-10-CM | POA: Diagnosis not present

## 2019-08-17 DIAGNOSIS — F0280 Dementia in other diseases classified elsewhere without behavioral disturbance: Secondary | ICD-10-CM | POA: Diagnosis not present

## 2019-08-17 DIAGNOSIS — F0391 Unspecified dementia with behavioral disturbance: Secondary | ICD-10-CM | POA: Diagnosis not present

## 2019-08-17 DIAGNOSIS — F411 Generalized anxiety disorder: Secondary | ICD-10-CM | POA: Diagnosis not present

## 2019-08-24 DIAGNOSIS — L988 Other specified disorders of the skin and subcutaneous tissue: Secondary | ICD-10-CM | POA: Diagnosis not present

## 2019-08-26 DIAGNOSIS — F411 Generalized anxiety disorder: Secondary | ICD-10-CM | POA: Diagnosis not present

## 2019-08-26 DIAGNOSIS — F0391 Unspecified dementia with behavioral disturbance: Secondary | ICD-10-CM | POA: Diagnosis not present

## 2019-08-26 DIAGNOSIS — G309 Alzheimer's disease, unspecified: Secondary | ICD-10-CM | POA: Diagnosis not present

## 2019-08-26 DIAGNOSIS — F39 Unspecified mood [affective] disorder: Secondary | ICD-10-CM | POA: Diagnosis not present

## 2019-08-26 DIAGNOSIS — F0280 Dementia in other diseases classified elsewhere without behavioral disturbance: Secondary | ICD-10-CM | POA: Diagnosis not present

## 2019-08-31 DIAGNOSIS — L988 Other specified disorders of the skin and subcutaneous tissue: Secondary | ICD-10-CM | POA: Diagnosis not present

## 2019-09-02 DIAGNOSIS — F0391 Unspecified dementia with behavioral disturbance: Secondary | ICD-10-CM | POA: Diagnosis not present

## 2019-09-02 DIAGNOSIS — F411 Generalized anxiety disorder: Secondary | ICD-10-CM | POA: Diagnosis not present

## 2019-09-02 DIAGNOSIS — F39 Unspecified mood [affective] disorder: Secondary | ICD-10-CM | POA: Diagnosis not present

## 2019-09-02 DIAGNOSIS — F0280 Dementia in other diseases classified elsewhere without behavioral disturbance: Secondary | ICD-10-CM | POA: Diagnosis not present

## 2019-09-02 DIAGNOSIS — G309 Alzheimer's disease, unspecified: Secondary | ICD-10-CM | POA: Diagnosis not present

## 2019-09-07 DIAGNOSIS — L988 Other specified disorders of the skin and subcutaneous tissue: Secondary | ICD-10-CM | POA: Diagnosis not present

## 2019-09-09 DIAGNOSIS — G309 Alzheimer's disease, unspecified: Secondary | ICD-10-CM | POA: Diagnosis not present

## 2019-09-09 DIAGNOSIS — F39 Unspecified mood [affective] disorder: Secondary | ICD-10-CM | POA: Diagnosis not present

## 2019-09-09 DIAGNOSIS — F411 Generalized anxiety disorder: Secondary | ICD-10-CM | POA: Diagnosis not present

## 2019-09-09 DIAGNOSIS — F0391 Unspecified dementia with behavioral disturbance: Secondary | ICD-10-CM | POA: Diagnosis not present

## 2019-09-16 DIAGNOSIS — F0391 Unspecified dementia with behavioral disturbance: Secondary | ICD-10-CM | POA: Diagnosis not present

## 2019-09-16 DIAGNOSIS — F0280 Dementia in other diseases classified elsewhere without behavioral disturbance: Secondary | ICD-10-CM | POA: Diagnosis not present

## 2019-09-16 DIAGNOSIS — G309 Alzheimer's disease, unspecified: Secondary | ICD-10-CM | POA: Diagnosis not present

## 2019-09-16 DIAGNOSIS — F411 Generalized anxiety disorder: Secondary | ICD-10-CM | POA: Diagnosis not present

## 2019-09-16 DIAGNOSIS — F39 Unspecified mood [affective] disorder: Secondary | ICD-10-CM | POA: Diagnosis not present

## 2019-09-23 DIAGNOSIS — F39 Unspecified mood [affective] disorder: Secondary | ICD-10-CM | POA: Diagnosis not present

## 2019-09-23 DIAGNOSIS — F0391 Unspecified dementia with behavioral disturbance: Secondary | ICD-10-CM | POA: Diagnosis not present

## 2019-09-23 DIAGNOSIS — F0280 Dementia in other diseases classified elsewhere without behavioral disturbance: Secondary | ICD-10-CM | POA: Diagnosis not present

## 2019-09-23 DIAGNOSIS — F411 Generalized anxiety disorder: Secondary | ICD-10-CM | POA: Diagnosis not present

## 2019-09-23 DIAGNOSIS — G309 Alzheimer's disease, unspecified: Secondary | ICD-10-CM | POA: Diagnosis not present

## 2019-09-30 DIAGNOSIS — F411 Generalized anxiety disorder: Secondary | ICD-10-CM | POA: Diagnosis not present

## 2019-09-30 DIAGNOSIS — F0280 Dementia in other diseases classified elsewhere without behavioral disturbance: Secondary | ICD-10-CM | POA: Diagnosis not present

## 2019-09-30 DIAGNOSIS — F0391 Unspecified dementia with behavioral disturbance: Secondary | ICD-10-CM | POA: Diagnosis not present

## 2019-09-30 DIAGNOSIS — G309 Alzheimer's disease, unspecified: Secondary | ICD-10-CM | POA: Diagnosis not present

## 2019-09-30 DIAGNOSIS — F39 Unspecified mood [affective] disorder: Secondary | ICD-10-CM | POA: Diagnosis not present

## 2019-10-05 DIAGNOSIS — L988 Other specified disorders of the skin and subcutaneous tissue: Secondary | ICD-10-CM | POA: Diagnosis not present

## 2019-10-06 DIAGNOSIS — G309 Alzheimer's disease, unspecified: Secondary | ICD-10-CM | POA: Diagnosis not present

## 2019-10-06 DIAGNOSIS — R293 Abnormal posture: Secondary | ICD-10-CM | POA: Diagnosis not present

## 2019-10-07 DIAGNOSIS — R293 Abnormal posture: Secondary | ICD-10-CM | POA: Diagnosis not present

## 2019-10-07 DIAGNOSIS — G309 Alzheimer's disease, unspecified: Secondary | ICD-10-CM | POA: Diagnosis not present

## 2019-10-07 DIAGNOSIS — F411 Generalized anxiety disorder: Secondary | ICD-10-CM | POA: Diagnosis not present

## 2019-10-07 DIAGNOSIS — F0280 Dementia in other diseases classified elsewhere without behavioral disturbance: Secondary | ICD-10-CM | POA: Diagnosis not present

## 2019-10-07 DIAGNOSIS — F39 Unspecified mood [affective] disorder: Secondary | ICD-10-CM | POA: Diagnosis not present

## 2019-10-07 DIAGNOSIS — F0391 Unspecified dementia with behavioral disturbance: Secondary | ICD-10-CM | POA: Diagnosis not present

## 2019-10-07 DIAGNOSIS — I10 Essential (primary) hypertension: Secondary | ICD-10-CM | POA: Diagnosis not present

## 2019-10-10 DIAGNOSIS — R293 Abnormal posture: Secondary | ICD-10-CM | POA: Diagnosis not present

## 2019-10-10 DIAGNOSIS — G309 Alzheimer's disease, unspecified: Secondary | ICD-10-CM | POA: Diagnosis not present

## 2019-10-11 DIAGNOSIS — R293 Abnormal posture: Secondary | ICD-10-CM | POA: Diagnosis not present

## 2019-10-11 DIAGNOSIS — G309 Alzheimer's disease, unspecified: Secondary | ICD-10-CM | POA: Diagnosis not present

## 2019-10-12 DIAGNOSIS — R293 Abnormal posture: Secondary | ICD-10-CM | POA: Diagnosis not present

## 2019-10-12 DIAGNOSIS — G309 Alzheimer's disease, unspecified: Secondary | ICD-10-CM | POA: Diagnosis not present

## 2019-10-12 DIAGNOSIS — F0391 Unspecified dementia with behavioral disturbance: Secondary | ICD-10-CM | POA: Diagnosis not present

## 2019-10-12 DIAGNOSIS — F411 Generalized anxiety disorder: Secondary | ICD-10-CM | POA: Diagnosis not present

## 2019-10-12 DIAGNOSIS — F39 Unspecified mood [affective] disorder: Secondary | ICD-10-CM | POA: Diagnosis not present

## 2019-10-12 DIAGNOSIS — L988 Other specified disorders of the skin and subcutaneous tissue: Secondary | ICD-10-CM | POA: Diagnosis not present

## 2019-10-13 DIAGNOSIS — R293 Abnormal posture: Secondary | ICD-10-CM | POA: Diagnosis not present

## 2019-10-13 DIAGNOSIS — G309 Alzheimer's disease, unspecified: Secondary | ICD-10-CM | POA: Diagnosis not present

## 2019-10-14 DIAGNOSIS — G309 Alzheimer's disease, unspecified: Secondary | ICD-10-CM | POA: Diagnosis not present

## 2019-10-14 DIAGNOSIS — F39 Unspecified mood [affective] disorder: Secondary | ICD-10-CM | POA: Diagnosis not present

## 2019-10-14 DIAGNOSIS — F0391 Unspecified dementia with behavioral disturbance: Secondary | ICD-10-CM | POA: Diagnosis not present

## 2019-10-14 DIAGNOSIS — F411 Generalized anxiety disorder: Secondary | ICD-10-CM | POA: Diagnosis not present

## 2019-10-19 DIAGNOSIS — L988 Other specified disorders of the skin and subcutaneous tissue: Secondary | ICD-10-CM | POA: Diagnosis not present

## 2019-10-21 DIAGNOSIS — F411 Generalized anxiety disorder: Secondary | ICD-10-CM | POA: Diagnosis not present

## 2019-10-21 DIAGNOSIS — G309 Alzheimer's disease, unspecified: Secondary | ICD-10-CM | POA: Diagnosis not present

## 2019-10-21 DIAGNOSIS — F0391 Unspecified dementia with behavioral disturbance: Secondary | ICD-10-CM | POA: Diagnosis not present

## 2019-10-21 DIAGNOSIS — F39 Unspecified mood [affective] disorder: Secondary | ICD-10-CM | POA: Diagnosis not present

## 2019-10-26 DIAGNOSIS — F0391 Unspecified dementia with behavioral disturbance: Secondary | ICD-10-CM | POA: Diagnosis not present

## 2019-10-26 DIAGNOSIS — E119 Type 2 diabetes mellitus without complications: Secondary | ICD-10-CM | POA: Diagnosis not present

## 2019-10-26 DIAGNOSIS — Z79899 Other long term (current) drug therapy: Secondary | ICD-10-CM | POA: Diagnosis not present

## 2019-10-26 DIAGNOSIS — F411 Generalized anxiety disorder: Secondary | ICD-10-CM | POA: Diagnosis not present

## 2019-10-26 DIAGNOSIS — G309 Alzheimer's disease, unspecified: Secondary | ICD-10-CM | POA: Diagnosis not present

## 2019-10-26 DIAGNOSIS — L988 Other specified disorders of the skin and subcutaneous tissue: Secondary | ICD-10-CM | POA: Diagnosis not present

## 2019-10-26 DIAGNOSIS — I10 Essential (primary) hypertension: Secondary | ICD-10-CM | POA: Diagnosis not present

## 2019-10-26 DIAGNOSIS — E039 Hypothyroidism, unspecified: Secondary | ICD-10-CM | POA: Diagnosis not present

## 2019-10-26 DIAGNOSIS — F39 Unspecified mood [affective] disorder: Secondary | ICD-10-CM | POA: Diagnosis not present

## 2019-10-26 DIAGNOSIS — N390 Urinary tract infection, site not specified: Secondary | ICD-10-CM | POA: Diagnosis not present

## 2019-10-28 DIAGNOSIS — F39 Unspecified mood [affective] disorder: Secondary | ICD-10-CM | POA: Diagnosis not present

## 2019-10-28 DIAGNOSIS — F411 Generalized anxiety disorder: Secondary | ICD-10-CM | POA: Diagnosis not present

## 2019-10-28 DIAGNOSIS — F0391 Unspecified dementia with behavioral disturbance: Secondary | ICD-10-CM | POA: Diagnosis not present

## 2019-10-28 DIAGNOSIS — G309 Alzheimer's disease, unspecified: Secondary | ICD-10-CM | POA: Diagnosis not present

## 2019-10-30 DIAGNOSIS — F419 Anxiety disorder, unspecified: Secondary | ICD-10-CM | POA: Diagnosis not present

## 2019-10-30 DIAGNOSIS — N320 Bladder-neck obstruction: Secondary | ICD-10-CM | POA: Diagnosis not present

## 2019-10-30 DIAGNOSIS — G309 Alzheimer's disease, unspecified: Secondary | ICD-10-CM | POA: Diagnosis not present

## 2019-10-30 DIAGNOSIS — I10 Essential (primary) hypertension: Secondary | ICD-10-CM | POA: Diagnosis not present

## 2019-11-08 DIAGNOSIS — Z79899 Other long term (current) drug therapy: Secondary | ICD-10-CM | POA: Diagnosis not present

## 2019-11-09 DIAGNOSIS — F39 Unspecified mood [affective] disorder: Secondary | ICD-10-CM | POA: Diagnosis not present

## 2019-11-09 DIAGNOSIS — G309 Alzheimer's disease, unspecified: Secondary | ICD-10-CM | POA: Diagnosis not present

## 2019-11-09 DIAGNOSIS — F0391 Unspecified dementia with behavioral disturbance: Secondary | ICD-10-CM | POA: Diagnosis not present

## 2019-11-09 DIAGNOSIS — F411 Generalized anxiety disorder: Secondary | ICD-10-CM | POA: Diagnosis not present

## 2019-11-18 DIAGNOSIS — G309 Alzheimer's disease, unspecified: Secondary | ICD-10-CM | POA: Diagnosis not present

## 2019-11-18 DIAGNOSIS — F39 Unspecified mood [affective] disorder: Secondary | ICD-10-CM | POA: Diagnosis not present

## 2019-11-18 DIAGNOSIS — F411 Generalized anxiety disorder: Secondary | ICD-10-CM | POA: Diagnosis not present

## 2019-11-18 DIAGNOSIS — F0391 Unspecified dementia with behavioral disturbance: Secondary | ICD-10-CM | POA: Diagnosis not present

## 2019-11-24 DIAGNOSIS — F411 Generalized anxiety disorder: Secondary | ICD-10-CM | POA: Diagnosis not present

## 2019-11-24 DIAGNOSIS — G309 Alzheimer's disease, unspecified: Secondary | ICD-10-CM | POA: Diagnosis not present

## 2019-11-24 DIAGNOSIS — F39 Unspecified mood [affective] disorder: Secondary | ICD-10-CM | POA: Diagnosis not present

## 2019-11-24 DIAGNOSIS — F0391 Unspecified dementia with behavioral disturbance: Secondary | ICD-10-CM | POA: Diagnosis not present

## 2019-11-27 DIAGNOSIS — F329 Major depressive disorder, single episode, unspecified: Secondary | ICD-10-CM | POA: Diagnosis not present

## 2019-11-27 DIAGNOSIS — I10 Essential (primary) hypertension: Secondary | ICD-10-CM | POA: Diagnosis not present

## 2019-11-27 DIAGNOSIS — R4702 Dysphasia: Secondary | ICD-10-CM | POA: Diagnosis not present

## 2019-11-27 DIAGNOSIS — F419 Anxiety disorder, unspecified: Secondary | ICD-10-CM | POA: Diagnosis not present

## 2019-11-29 DIAGNOSIS — K219 Gastro-esophageal reflux disease without esophagitis: Secondary | ICD-10-CM | POA: Diagnosis not present

## 2019-11-29 DIAGNOSIS — J189 Pneumonia, unspecified organism: Secondary | ICD-10-CM | POA: Diagnosis not present

## 2019-11-29 DIAGNOSIS — R1312 Dysphagia, oropharyngeal phase: Secondary | ICD-10-CM | POA: Diagnosis not present

## 2019-11-29 DIAGNOSIS — G309 Alzheimer's disease, unspecified: Secondary | ICD-10-CM | POA: Diagnosis not present

## 2019-11-29 DIAGNOSIS — R1311 Dysphagia, oral phase: Secondary | ICD-10-CM | POA: Diagnosis not present

## 2019-11-30 DIAGNOSIS — L988 Other specified disorders of the skin and subcutaneous tissue: Secondary | ICD-10-CM | POA: Diagnosis not present

## 2019-12-01 DIAGNOSIS — J189 Pneumonia, unspecified organism: Secondary | ICD-10-CM | POA: Diagnosis not present

## 2019-12-01 DIAGNOSIS — G309 Alzheimer's disease, unspecified: Secondary | ICD-10-CM | POA: Diagnosis not present

## 2019-12-01 DIAGNOSIS — R1312 Dysphagia, oropharyngeal phase: Secondary | ICD-10-CM | POA: Diagnosis not present

## 2019-12-01 DIAGNOSIS — K219 Gastro-esophageal reflux disease without esophagitis: Secondary | ICD-10-CM | POA: Diagnosis not present

## 2019-12-01 DIAGNOSIS — R1311 Dysphagia, oral phase: Secondary | ICD-10-CM | POA: Diagnosis not present

## 2019-12-05 DIAGNOSIS — J189 Pneumonia, unspecified organism: Secondary | ICD-10-CM | POA: Diagnosis not present

## 2019-12-05 DIAGNOSIS — G309 Alzheimer's disease, unspecified: Secondary | ICD-10-CM | POA: Diagnosis not present

## 2019-12-05 DIAGNOSIS — R1312 Dysphagia, oropharyngeal phase: Secondary | ICD-10-CM | POA: Diagnosis not present

## 2019-12-05 DIAGNOSIS — K219 Gastro-esophageal reflux disease without esophagitis: Secondary | ICD-10-CM | POA: Diagnosis not present

## 2019-12-05 DIAGNOSIS — R1311 Dysphagia, oral phase: Secondary | ICD-10-CM | POA: Diagnosis not present

## 2019-12-06 DIAGNOSIS — R1312 Dysphagia, oropharyngeal phase: Secondary | ICD-10-CM | POA: Diagnosis not present

## 2019-12-06 DIAGNOSIS — J189 Pneumonia, unspecified organism: Secondary | ICD-10-CM | POA: Diagnosis not present

## 2019-12-06 DIAGNOSIS — K219 Gastro-esophageal reflux disease without esophagitis: Secondary | ICD-10-CM | POA: Diagnosis not present

## 2019-12-06 DIAGNOSIS — R1311 Dysphagia, oral phase: Secondary | ICD-10-CM | POA: Diagnosis not present

## 2019-12-06 DIAGNOSIS — G309 Alzheimer's disease, unspecified: Secondary | ICD-10-CM | POA: Diagnosis not present

## 2019-12-07 DIAGNOSIS — F411 Generalized anxiety disorder: Secondary | ICD-10-CM | POA: Diagnosis not present

## 2019-12-07 DIAGNOSIS — F39 Unspecified mood [affective] disorder: Secondary | ICD-10-CM | POA: Diagnosis not present

## 2019-12-07 DIAGNOSIS — F0391 Unspecified dementia with behavioral disturbance: Secondary | ICD-10-CM | POA: Diagnosis not present

## 2019-12-07 DIAGNOSIS — L988 Other specified disorders of the skin and subcutaneous tissue: Secondary | ICD-10-CM | POA: Diagnosis not present

## 2019-12-07 DIAGNOSIS — I10 Essential (primary) hypertension: Secondary | ICD-10-CM | POA: Diagnosis not present

## 2019-12-07 DIAGNOSIS — G309 Alzheimer's disease, unspecified: Secondary | ICD-10-CM | POA: Diagnosis not present

## 2019-12-08 DIAGNOSIS — K219 Gastro-esophageal reflux disease without esophagitis: Secondary | ICD-10-CM | POA: Diagnosis not present

## 2019-12-08 DIAGNOSIS — R1312 Dysphagia, oropharyngeal phase: Secondary | ICD-10-CM | POA: Diagnosis not present

## 2019-12-08 DIAGNOSIS — G309 Alzheimer's disease, unspecified: Secondary | ICD-10-CM | POA: Diagnosis not present

## 2019-12-08 DIAGNOSIS — R1311 Dysphagia, oral phase: Secondary | ICD-10-CM | POA: Diagnosis not present

## 2019-12-08 DIAGNOSIS — J189 Pneumonia, unspecified organism: Secondary | ICD-10-CM | POA: Diagnosis not present

## 2019-12-11 DIAGNOSIS — I10 Essential (primary) hypertension: Secondary | ICD-10-CM | POA: Diagnosis not present

## 2019-12-11 DIAGNOSIS — R110 Nausea: Secondary | ICD-10-CM | POA: Diagnosis not present

## 2019-12-12 DIAGNOSIS — N390 Urinary tract infection, site not specified: Secondary | ICD-10-CM | POA: Diagnosis not present

## 2019-12-12 DIAGNOSIS — R5382 Chronic fatigue, unspecified: Secondary | ICD-10-CM | POA: Diagnosis not present

## 2019-12-14 DIAGNOSIS — L988 Other specified disorders of the skin and subcutaneous tissue: Secondary | ICD-10-CM | POA: Diagnosis not present

## 2019-12-19 DIAGNOSIS — M6281 Muscle weakness (generalized): Secondary | ICD-10-CM | POA: Diagnosis not present

## 2019-12-19 DIAGNOSIS — Z8673 Personal history of transient ischemic attack (TIA), and cerebral infarction without residual deficits: Secondary | ICD-10-CM | POA: Diagnosis not present

## 2019-12-19 DIAGNOSIS — R2681 Unsteadiness on feet: Secondary | ICD-10-CM | POA: Diagnosis not present

## 2019-12-19 DIAGNOSIS — M25512 Pain in left shoulder: Secondary | ICD-10-CM | POA: Diagnosis not present

## 2019-12-19 DIAGNOSIS — G309 Alzheimer's disease, unspecified: Secondary | ICD-10-CM | POA: Diagnosis not present

## 2019-12-19 DIAGNOSIS — R293 Abnormal posture: Secondary | ICD-10-CM | POA: Diagnosis not present

## 2019-12-19 DIAGNOSIS — R278 Other lack of coordination: Secondary | ICD-10-CM | POA: Diagnosis not present

## 2019-12-20 DIAGNOSIS — G309 Alzheimer's disease, unspecified: Secondary | ICD-10-CM | POA: Diagnosis not present

## 2019-12-20 DIAGNOSIS — R2681 Unsteadiness on feet: Secondary | ICD-10-CM | POA: Diagnosis not present

## 2019-12-20 DIAGNOSIS — R293 Abnormal posture: Secondary | ICD-10-CM | POA: Diagnosis not present

## 2019-12-20 DIAGNOSIS — M25512 Pain in left shoulder: Secondary | ICD-10-CM | POA: Diagnosis not present

## 2019-12-20 DIAGNOSIS — M6281 Muscle weakness (generalized): Secondary | ICD-10-CM | POA: Diagnosis not present

## 2019-12-20 DIAGNOSIS — R278 Other lack of coordination: Secondary | ICD-10-CM | POA: Diagnosis not present

## 2019-12-21 DIAGNOSIS — R293 Abnormal posture: Secondary | ICD-10-CM | POA: Diagnosis not present

## 2019-12-21 DIAGNOSIS — G309 Alzheimer's disease, unspecified: Secondary | ICD-10-CM | POA: Diagnosis not present

## 2019-12-21 DIAGNOSIS — R278 Other lack of coordination: Secondary | ICD-10-CM | POA: Diagnosis not present

## 2019-12-21 DIAGNOSIS — M25512 Pain in left shoulder: Secondary | ICD-10-CM | POA: Diagnosis not present

## 2019-12-21 DIAGNOSIS — R2681 Unsteadiness on feet: Secondary | ICD-10-CM | POA: Diagnosis not present

## 2019-12-21 DIAGNOSIS — L988 Other specified disorders of the skin and subcutaneous tissue: Secondary | ICD-10-CM | POA: Diagnosis not present

## 2019-12-21 DIAGNOSIS — M6281 Muscle weakness (generalized): Secondary | ICD-10-CM | POA: Diagnosis not present

## 2019-12-22 DIAGNOSIS — R278 Other lack of coordination: Secondary | ICD-10-CM | POA: Diagnosis not present

## 2019-12-22 DIAGNOSIS — R2681 Unsteadiness on feet: Secondary | ICD-10-CM | POA: Diagnosis not present

## 2019-12-22 DIAGNOSIS — R293 Abnormal posture: Secondary | ICD-10-CM | POA: Diagnosis not present

## 2019-12-22 DIAGNOSIS — G309 Alzheimer's disease, unspecified: Secondary | ICD-10-CM | POA: Diagnosis not present

## 2019-12-22 DIAGNOSIS — M6281 Muscle weakness (generalized): Secondary | ICD-10-CM | POA: Diagnosis not present

## 2019-12-22 DIAGNOSIS — M25512 Pain in left shoulder: Secondary | ICD-10-CM | POA: Diagnosis not present

## 2019-12-23 DIAGNOSIS — M6281 Muscle weakness (generalized): Secondary | ICD-10-CM | POA: Diagnosis not present

## 2019-12-23 DIAGNOSIS — R2681 Unsteadiness on feet: Secondary | ICD-10-CM | POA: Diagnosis not present

## 2019-12-23 DIAGNOSIS — R278 Other lack of coordination: Secondary | ICD-10-CM | POA: Diagnosis not present

## 2019-12-23 DIAGNOSIS — G309 Alzheimer's disease, unspecified: Secondary | ICD-10-CM | POA: Diagnosis not present

## 2019-12-23 DIAGNOSIS — R293 Abnormal posture: Secondary | ICD-10-CM | POA: Diagnosis not present

## 2019-12-23 DIAGNOSIS — M25512 Pain in left shoulder: Secondary | ICD-10-CM | POA: Diagnosis not present

## 2019-12-26 DIAGNOSIS — R2681 Unsteadiness on feet: Secondary | ICD-10-CM | POA: Diagnosis not present

## 2019-12-26 DIAGNOSIS — M6281 Muscle weakness (generalized): Secondary | ICD-10-CM | POA: Diagnosis not present

## 2019-12-26 DIAGNOSIS — R634 Abnormal weight loss: Secondary | ICD-10-CM | POA: Diagnosis not present

## 2019-12-26 DIAGNOSIS — F39 Unspecified mood [affective] disorder: Secondary | ICD-10-CM | POA: Diagnosis not present

## 2019-12-26 DIAGNOSIS — R278 Other lack of coordination: Secondary | ICD-10-CM | POA: Diagnosis not present

## 2019-12-26 DIAGNOSIS — G309 Alzheimer's disease, unspecified: Secondary | ICD-10-CM | POA: Diagnosis not present

## 2019-12-26 DIAGNOSIS — F0391 Unspecified dementia with behavioral disturbance: Secondary | ICD-10-CM | POA: Diagnosis not present

## 2019-12-26 DIAGNOSIS — R293 Abnormal posture: Secondary | ICD-10-CM | POA: Diagnosis not present

## 2019-12-26 DIAGNOSIS — M25512 Pain in left shoulder: Secondary | ICD-10-CM | POA: Diagnosis not present

## 2019-12-26 DIAGNOSIS — F411 Generalized anxiety disorder: Secondary | ICD-10-CM | POA: Diagnosis not present

## 2019-12-28 DIAGNOSIS — L988 Other specified disorders of the skin and subcutaneous tissue: Secondary | ICD-10-CM | POA: Diagnosis not present

## 2019-12-29 DIAGNOSIS — M6281 Muscle weakness (generalized): Secondary | ICD-10-CM | POA: Diagnosis not present

## 2019-12-29 DIAGNOSIS — M25512 Pain in left shoulder: Secondary | ICD-10-CM | POA: Diagnosis not present

## 2019-12-29 DIAGNOSIS — R293 Abnormal posture: Secondary | ICD-10-CM | POA: Diagnosis not present

## 2019-12-29 DIAGNOSIS — R2681 Unsteadiness on feet: Secondary | ICD-10-CM | POA: Diagnosis not present

## 2019-12-29 DIAGNOSIS — R278 Other lack of coordination: Secondary | ICD-10-CM | POA: Diagnosis not present

## 2019-12-29 DIAGNOSIS — G309 Alzheimer's disease, unspecified: Secondary | ICD-10-CM | POA: Diagnosis not present

## 2019-12-30 DIAGNOSIS — R634 Abnormal weight loss: Secondary | ICD-10-CM | POA: Diagnosis not present

## 2019-12-30 DIAGNOSIS — F39 Unspecified mood [affective] disorder: Secondary | ICD-10-CM | POA: Diagnosis not present

## 2019-12-30 DIAGNOSIS — F0391 Unspecified dementia with behavioral disturbance: Secondary | ICD-10-CM | POA: Diagnosis not present

## 2019-12-30 DIAGNOSIS — F411 Generalized anxiety disorder: Secondary | ICD-10-CM | POA: Diagnosis not present

## 2019-12-30 DIAGNOSIS — G309 Alzheimer's disease, unspecified: Secondary | ICD-10-CM | POA: Diagnosis not present

## 2020-01-04 DIAGNOSIS — L988 Other specified disorders of the skin and subcutaneous tissue: Secondary | ICD-10-CM | POA: Diagnosis not present

## 2020-01-11 DIAGNOSIS — L988 Other specified disorders of the skin and subcutaneous tissue: Secondary | ICD-10-CM | POA: Diagnosis not present

## 2020-03-13 DEATH — deceased

## 2020-04-05 IMAGING — MR MR CERVICAL SPINE W/O CM
4 of 12 series · 18 of 48 positions shown · non-contrast
Comparison: None.

CLINICAL DATA: Leg weakness since stroke in March 2016 but states
symptoms are worse for past couple days. unable to ambulate

EXAM:
MRI CERVICAL AND THORACIC SPINE WITHOUT CONTRAST
TECHNIQUE: Multiplanar and multiecho pulse sequences of the cervical spine and
thoracic spine, were obtained without intravenous contrast.

[Series 6: T2 · sagittal · 3.0mm · 0.41mm/px · 3 of 14 slices shown (1 of 3)]
[im 1/14]
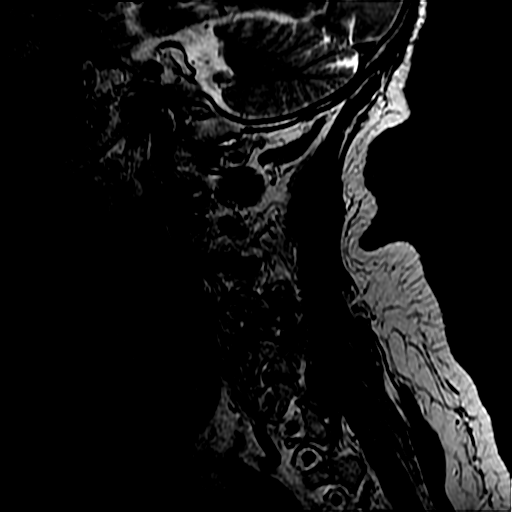
[im 7/14]
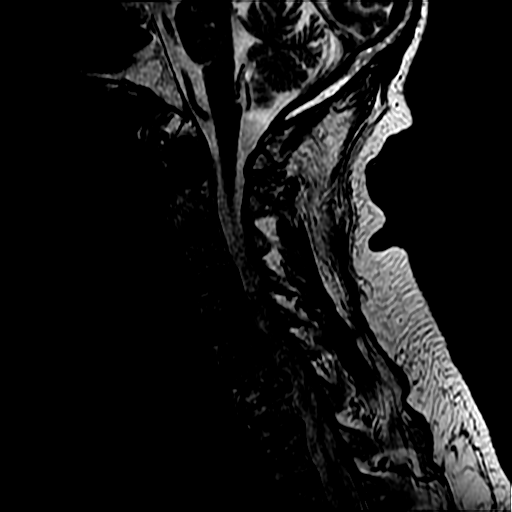
[im 14/14]
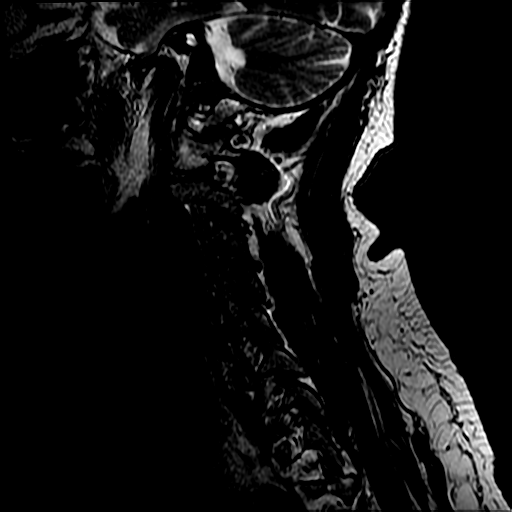

[Series 9: T2 · axial · 3.1mm · 0.37mm/px · z∈[-98,-4]mm · 5 of 29 slices shown (2 of 3)]
[im 1/29]
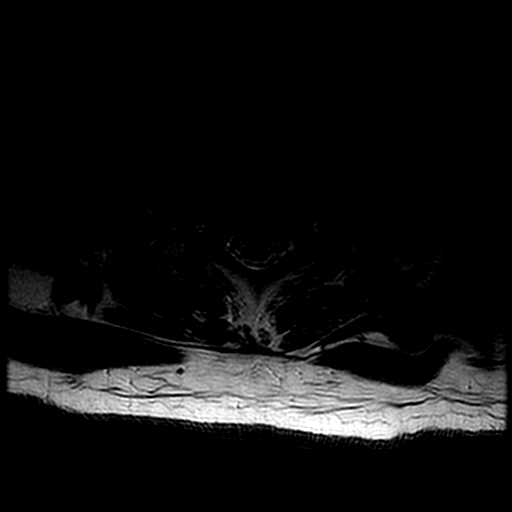
[im 8/29]
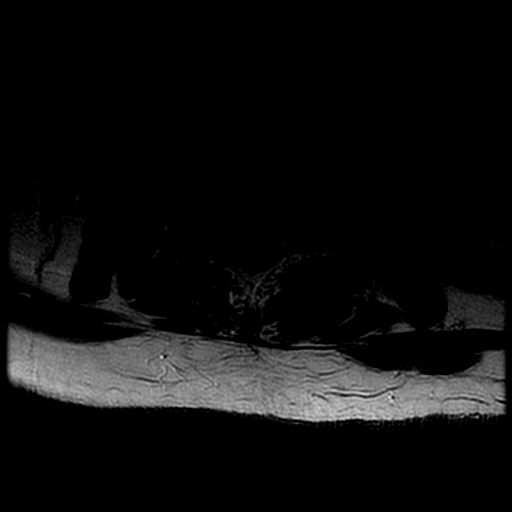
[im 15/29]
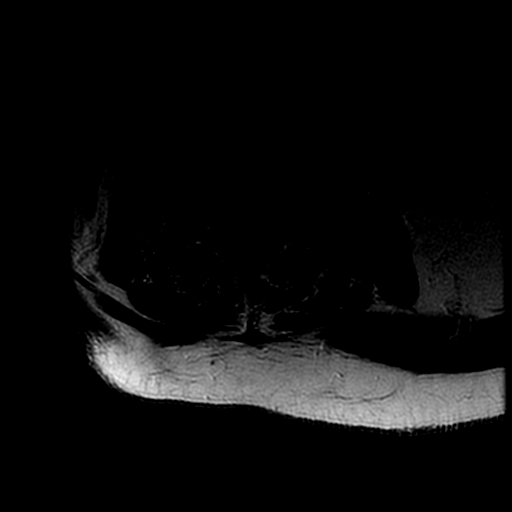
[im 22/29]
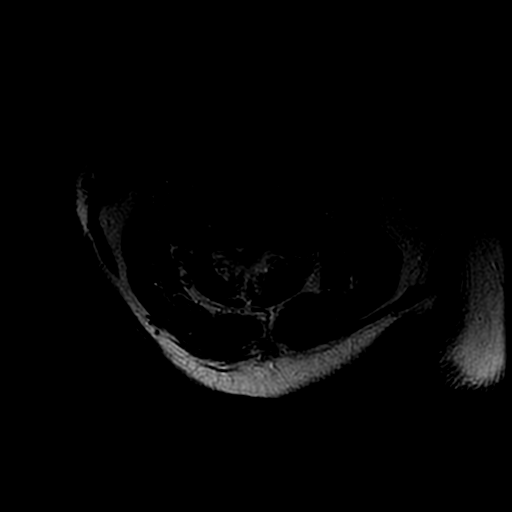
[im 29/29]
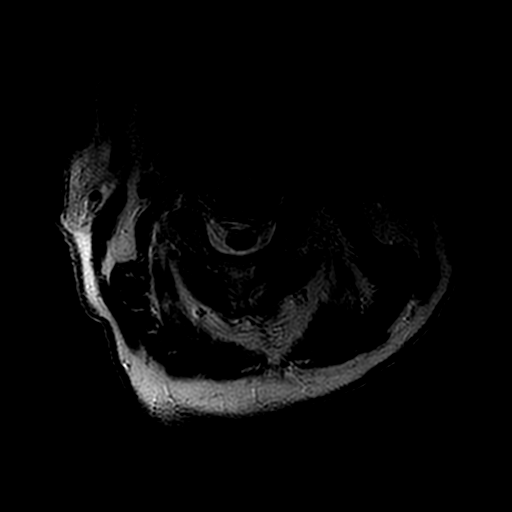

[Series 15: T2 post-contrast · sagittal · 3.0mm · 0.66mm/px · 3 of 16 slices shown]
[im 1/16]
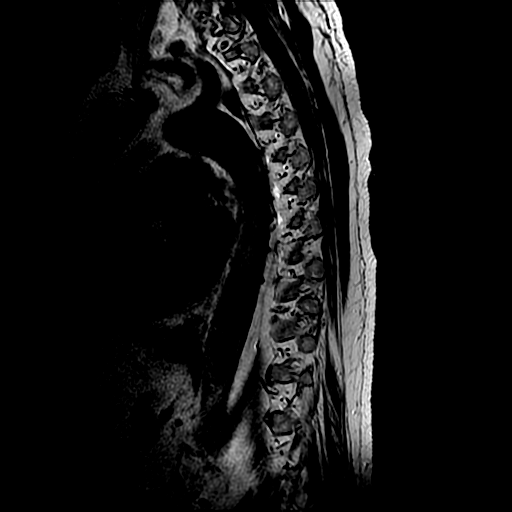
[im 8/16]
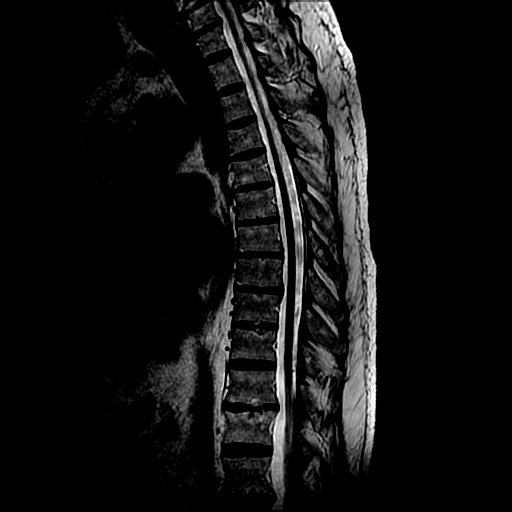
[im 16/16]
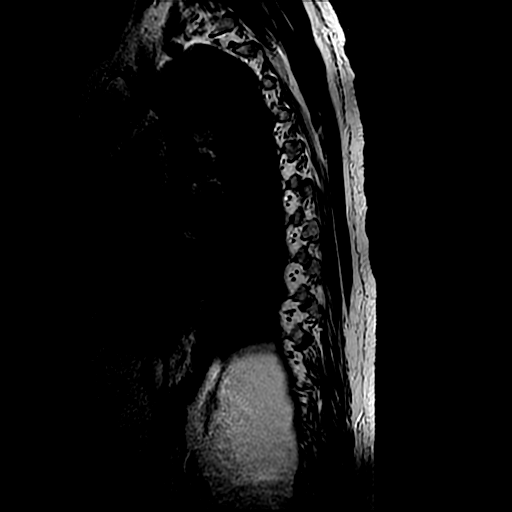

[Series 16: T2 · axial · 4.0mm · 0.41mm/px · z∈[-367,-145]mm · 7 of 40 slices shown (3 of 3)]
[im 1/40]
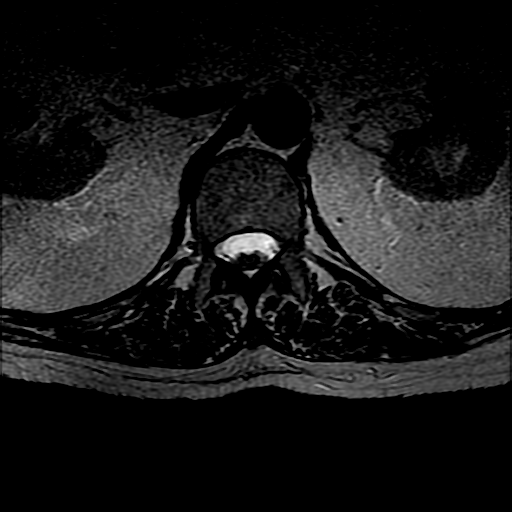
[im 6/40]
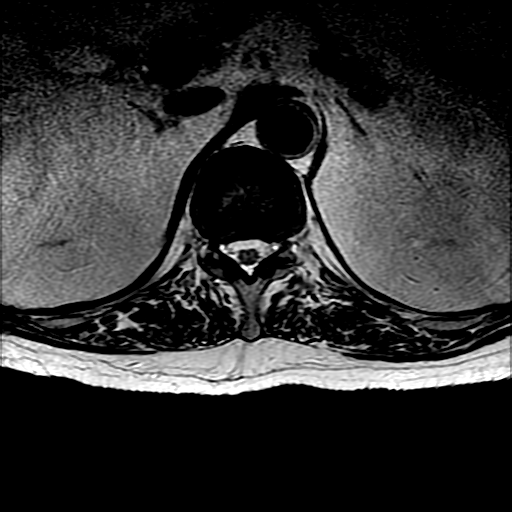
[im 12/40]
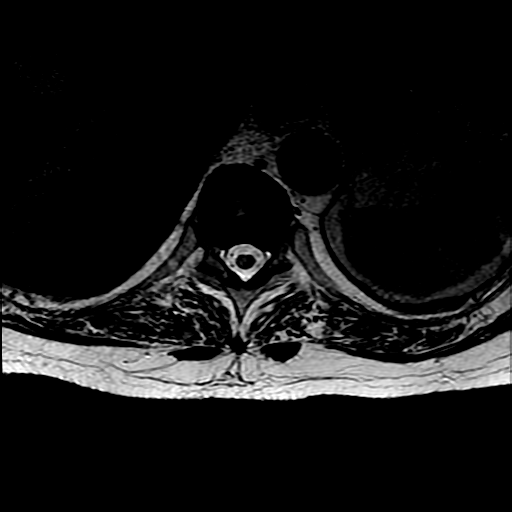
[im 17/40]
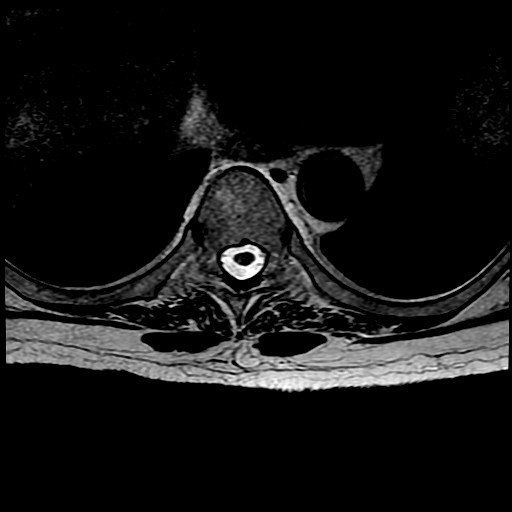
[im 23/40]
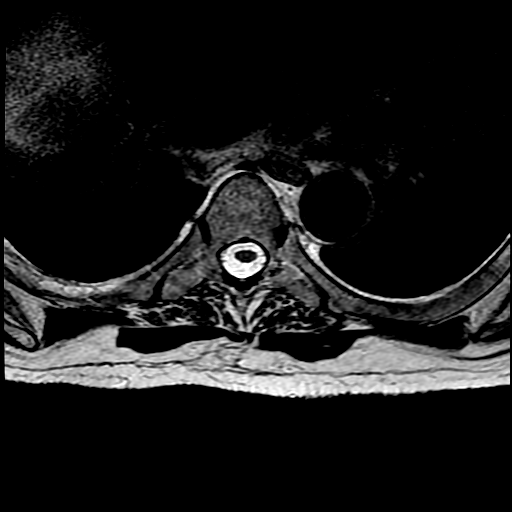
[im 28/40]
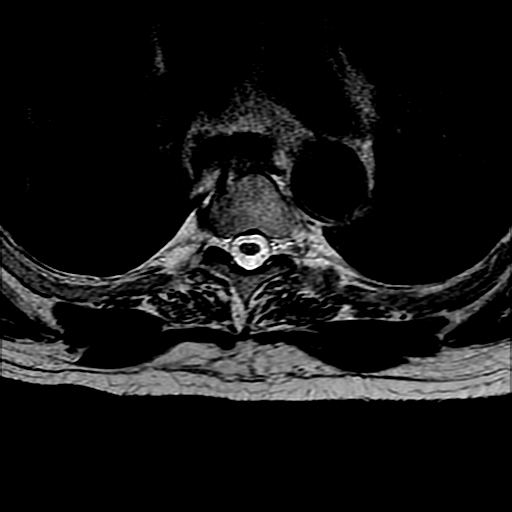
[im 34/40]
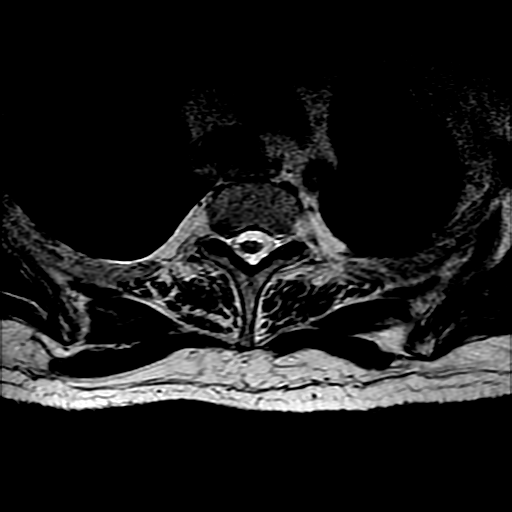

[18 of 48 positions shown; findings below may reference images not displayed]

FINDINGS: MRI CERVICAL SPINE FINDINGS

Alignment: Physiologic.

Vertebrae: No fracture, evidence of discitis, or bone lesion.

Cord: Normal signal and morphology.

Posterior Fossa, vertebral arteries, paraspinal tissues: Posterior
fossa demonstrates no focal abnormality. Vertebral artery flow voids
are maintained. Paraspinal soft tissues are unremarkable.

Disc levels:

Discs: Degenerative disc disease with disc height loss at C6-7 and
C7-T1.

C2-3: No significant disc bulge. No neural foraminal stenosis. No
central canal stenosis.

C3-4: No significant disc bulge. Severe left facet arthropathy. Mild
bilateral foraminal stenosis. No central canal stenosis.

C4-5: Broad-based disc bulge with a broad central disc protrusion.
Moderate left and mild right facet arthropathy. Moderate right and
severe left foraminal stenosis. Mild spinal stenosis.

C5-6: Mild broad-based disc bulge with a shallow central disc
protrusion. Left uncovertebral degenerative changes mild left
foraminal stenosis. No neural foraminal stenosis. No central canal
stenosis.

C6-7: No significant disc bulge. Left uncovertebral degenerative
changes and left facet arthropathy resulting in severe left
foraminal stenosis. Moderate right foraminal stenosis. No central
canal stenosis.

C7-T1: Mild broad-based disc bulge. Mild bilateral foraminal
stenosis. No central canal stenosis.

MRI THORACIC SPINE FINDINGS

Segmentation:  Standard.

Alignment:  Physiologic.

Vertebrae:  No fracture, evidence of discitis, or bone lesion.

Cord: Normal.

Paraspinal and other soft tissues: No acute paraspinal abnormality.

Disc levels:

Disc spaces:  Disc spaces are maintained.

T1-T2: No disc protrusion, foraminal stenosis or central canal
stenosis.

T2-T3: No disc protrusion, foraminal stenosis or central canal
stenosis.

T3-T4: No disc protrusion, foraminal stenosis or central canal
stenosis.

T4-T5: Tiny central disc protrusion. No foraminal or central canal
stenosis.

T5-T6: No disc protrusion, foraminal stenosis or central canal
stenosis.

T6-T7: No disc protrusion, foraminal stenosis or central canal
stenosis.

T7-T8: No disc protrusion, foraminal stenosis or central canal
stenosis.

T8-T9: Tiny central disc protrusion. No foraminal or central canal
stenosis.

T9-T10: No disc protrusion, foraminal stenosis or central canal
stenosis.

T10-T11: No disc protrusion, foraminal stenosis or central canal
stenosis.

T11-T12: Mild broad-based disc bulge. No foraminal or central canal
stenosis.

L1-2: Mild broad-based disc bulge. No foraminal or central canal
stenosis.
IMPRESSION: 1.  No acute osseous injury of the cervical spine.
2.  No acute osseous injury of the thoracic spine.
3. Mild thoracolumbar spine spondylosis as described above.
